# Patient Record
Sex: Male | Born: 1970 | Race: Black or African American | Hispanic: No | Marital: Single | Smoking: Current some day smoker
Health system: Southern US, Community
[De-identification: ages and names within clinical notes are randomized; demographics above are authoritative.]

## PROBLEM LIST (undated history)

## (undated) DIAGNOSIS — I10 Essential (primary) hypertension: Secondary | ICD-10-CM

## (undated) DIAGNOSIS — W3400XA Accidental discharge from unspecified firearms or gun, initial encounter: Secondary | ICD-10-CM

## (undated) DIAGNOSIS — R7303 Prediabetes: Secondary | ICD-10-CM

## (undated) DIAGNOSIS — Y249XXA Unspecified firearm discharge, undetermined intent, initial encounter: Secondary | ICD-10-CM

## (undated) HISTORY — DX: Prediabetes: R73.03

---

## 2015-01-24 ENCOUNTER — Emergency Department (HOSPITAL_COMMUNITY)
Admission: EM | Admit: 2015-01-24 | Discharge: 2015-01-24 | Disposition: A | Discharge status: Home / Self Care | Payer: Managed Care, Other (non HMO) | Attending: Emergency Medicine | Admitting: Emergency Medicine

## 2015-01-24 ENCOUNTER — Encounter (HOSPITAL_COMMUNITY): Payer: Self-pay | Admitting: Nurse Practitioner

## 2015-01-24 DIAGNOSIS — Z720 Tobacco use: Secondary | ICD-10-CM | POA: Insufficient documentation

## 2015-01-24 DIAGNOSIS — M436 Torticollis: Secondary | ICD-10-CM | POA: Insufficient documentation

## 2015-01-24 DIAGNOSIS — M542 Cervicalgia: Secondary | ICD-10-CM | POA: Diagnosis present

## 2015-01-24 MED ORDER — IBUPROFEN 600 MG PO TABS: 600.0000 mg | ORAL_TABLET | Freq: Four times a day (QID) | ORAL | Status: DC | PRN

## 2015-01-24 MED ORDER — METHOCARBAMOL 500 MG PO TABS
500.0000 mg | ORAL_TABLET | Freq: Once | ORAL | Status: AC
  Administered 2015-01-24: 500 mg via ORAL
  Filled 2015-01-24: qty 1

## 2015-01-24 MED ORDER — METHOCARBAMOL 500 MG PO TABS: 500.0000 mg | ORAL_TABLET | Freq: Two times a day (BID) | ORAL | Status: DC

## 2015-01-24 NOTE — Discharge Instructions (Signed)
Please follow up with your primary care physician in 1-2 days. If you do not have one please call the [facility] number listed above. Please take pain medication and/or muscle relaxants as prescribed and as needed for pain. Please do not drive on narcotic pain medication or on muscle relaxants. Please read all discharge instructions and return precautions.  ° °Torticollis, Acute °You have suddenly (acutely) developed a twisted neck (torticollis). This is usually a self-limited condition. °CAUSES  °Acute torticollis may be caused by malposition, trauma or infection. Most commonly, acute torticollis is caused by sleeping in an awkward position. Torticollis may also be caused by the flexion, extension or twisting of the neck muscles beyond their normal position. Sometimes, the exact cause may not be known. °SYMPTOMS  °Usually, there is pain and limited movement of the neck. Your neck may twist to one side. °DIAGNOSIS  °The diagnosis is often made by physical examination. X-rays, CT scans or MRIs may be done if there is a history of trauma or concern of infection. °TREATMENT  °For a common, stiff neck that develops during sleep, treatment is focused on relaxing the contracted neck muscle. Medications (including shots) may be used to treat the problem. Most cases resolve in several days. Torticollis usually responds to conservative physical therapy. If left untreated, the shortened and spastic neck muscle can cause deformities in the face and neck. Rarely, surgery is required. °HOME CARE INSTRUCTIONS  °· Use over-the-counter and prescription medications as directed by your caregiver. °· Do stretching exercises and massage the neck as directed by your caregiver. °· Follow up with physical therapy if needed and as directed by your caregiver. °SEEK IMMEDIATE MEDICAL CARE IF:  °· You develop difficulty breathing or noisy breathing (stridor). °· You drool, develop trouble swallowing or have pain with  swallowing. °· You develop numbness or weakness in the hands or feet. °· You have changes in speech or vision. °· You have problems with urination or bowel movements. °· You have difficulty walking. °· You have a fever. °· You have increased pain. °MAKE SURE YOU:  °· Understand these instructions. °· Will watch your condition. °· Will get help right away if you are not doing well or get worse. °Document Released: 08/24/2000 Document Revised: 11/19/2011 Document Reviewed: 10/05/2009 °[employer]® Patient Information ©2015 [employer]. This information is not intended to replace advice given to you by your health care provider. Make sure you discuss any questions you have with your health care provider. ° °

## 2015-01-24 NOTE — ED Notes (Signed)
He woke this am with pain and stiffness in neck and its been worse since. He had to leave work because he can not perform his job since he cant turn his neck and his boss told him he had to come to the hospital for evaluation. He has tried nothing for the pain. States 'it feels like a crick." A&Ox4, resp e/u

## 2015-01-24 NOTE — ED Provider Notes (Signed)
CSN: [id number]     Arrival date & time 01/24/15  1349 History  This chart was scribed for non-physician practitioner working with Doug SouSam Jacubowitz, MD by Richarda Overlieichard Holland, ED Scribe. This patient was seen in room TR06C/TR06C and the patient's care was started at 3:21 PM.   Chief Complaint  Patient presents with  . Neck Pain   The history is provided by the patient. No language interpreter was used.   HPI Comments: Edward Watts is a 44 y.o. male who presents to the Emergency Department complaining of worsening left sided neck pain that started this morning. He states that turning his neck to the left aggravates his pain. He says he had to leave work because his pain is affecting his job performance and his boss told him to present to the hospital for evaluation. He states he has taken no medications PTA. Pt denies prior neck surgeries. He denies any falls or injuries. He reports no pertinent past medical history at this time. Pt denies HA, CP, trouble breathing or back pain at this time.   History reviewed. No pertinent past medical history.   History reviewed. No pertinent past surgical history.   History reviewed. No pertinent family history.   History  Substance Use Topics  . Smoking status: Current Every Day Smoker    Types: Cigarettes  . Smokeless tobacco: Not on file  . Alcohol Use: No    Review of Systems  Cardiovascular: Negative for chest pain.  Musculoskeletal: Positive for neck pain.  Neurological: Negative for weakness and headaches.  All other systems reviewed and are negative.   Allergies  Review of patient's allergies indicates no known allergies.  Home Medications   Prior to Admission medications   Medication Sig Start Date End Date Taking? Authorizing Provider  ibuprofen (ADVIL,MOTRIN) 600 MG tablet Take 1 tablet (600 mg total) by mouth every 6 (six) hours as needed. 01/24/15   Sophina Mitten, PA-C  methocarbamol (ROBAXIN) 500 MG tablet Take 1 tablet  (500 mg total) by mouth 2 (two) times daily. 01/24/15   Muriel Wilber, PA-C   BP 169/112 mmHg  Pulse 95  Temp(Src) 98.1 F (36.7 C) (Oral)  Resp 20  SpO2 97% Physical Exam  Constitutional: He is oriented to person, place, and time. He appears well-developed and well-nourished. No distress.  HENT:  Head: Normocephalic and atraumatic.  Right Ear: External ear normal.  Left Ear: External ear normal.  Nose: Nose normal.  Mouth/Throat: Oropharynx is clear and moist. No oropharyngeal exudate.  Eyes: Conjunctivae and EOM are normal. Pupils are equal, round, and reactive to light.  Neck: Normal range of motion. Neck supple. Muscular tenderness present. No spinous process tenderness present. No edema, no erythema and normal range of motion present.    Cardiovascular: Normal rate, regular rhythm, normal heart sounds and intact distal pulses.   Pulmonary/Chest: Effort normal and breath sounds normal. No respiratory distress.  Abdominal: Soft. There is no tenderness.  Neurological: He is alert and oriented to person, place, and time. He has normal strength. No cranial nerve deficit. Gait normal. GCS eye subscore is 4. GCS verbal subscore is 5. GCS motor subscore is 6.  Sensation grossly intact.  No pronator drift.  Bilateral heel-knee-shin intact.  Skin: Skin is warm and dry. He is not diaphoretic.  Nursing note and vitals reviewed.   ED Course  Procedures   Medications  methocarbamol (ROBAXIN) tablet 500 mg (500 mg Oral Given 01/24/15 1534)    DIAGNOSTIC STUDIES: Oxygen Saturation is 95%  on RA, normal by my interpretation.    COORDINATION OF CARE: 3:24 PM Discussed treatment plan with pt at bedside and pt agreed to plan.   Labs Review Labs Reviewed - No data to display  Imaging Review No results found.   EKG Interpretation None      MDM   Final diagnoses:  Left torticollis   Filed Vitals:   01/24/15 1529  BP: 169/112  Pulse: 95  Temp: 98.1 F (36.7 C)   Resp: 20   Afebrile, NAD, non-toxic appearing, AAOx4 appropriate for age.   1) Torticollis: No neurofocal deficits on examination. No spinous process tenderness. Paraspinal spasm noted on left. Will treat with conservative measures.  2) HTN: Patient noted to be hypertensive in the emergency department.  No signs of hypertensive urgency.  Discussed with patient the need for close follow-up and management by their primary care physician.   Return precautions discussed. Patient is agreeable to plan. Patient stable at time of discharge.   I personally performed the services described in this documentation, which was scribed in my presence. The recorded information has been reviewed and is accurate.       Francee PiccoloJennifer Zaylee Cornia, PA-C 01/24/15 1625  Doug SouSam Jacubowitz, MD 01/24/15 1743

## 2018-06-11 ENCOUNTER — Emergency Department (HOSPITAL_COMMUNITY): Payer: BLUE CROSS/BLUE SHIELD

## 2018-06-11 ENCOUNTER — Encounter (HOSPITAL_COMMUNITY): Payer: Self-pay | Admitting: Emergency Medicine

## 2018-06-11 ENCOUNTER — Emergency Department (HOSPITAL_COMMUNITY)
Admission: EM | Admit: 2018-06-11 | Discharge: 2018-06-11 | Disposition: A | Discharge status: Home / Self Care | Payer: BLUE CROSS/BLUE SHIELD | Attending: Emergency Medicine | Admitting: Emergency Medicine

## 2018-06-11 DIAGNOSIS — F17210 Nicotine dependence, cigarettes, uncomplicated: Secondary | ICD-10-CM | POA: Insufficient documentation

## 2018-06-11 DIAGNOSIS — J40 Bronchitis, not specified as acute or chronic: Secondary | ICD-10-CM

## 2018-06-11 DIAGNOSIS — Z9119 Patient's noncompliance with other medical treatment and regimen: Secondary | ICD-10-CM | POA: Insufficient documentation

## 2018-06-11 DIAGNOSIS — I10 Essential (primary) hypertension: Secondary | ICD-10-CM | POA: Insufficient documentation

## 2018-06-11 DIAGNOSIS — R05 Cough: Secondary | ICD-10-CM | POA: Diagnosis present

## 2018-06-11 DIAGNOSIS — R030 Elevated blood-pressure reading, without diagnosis of hypertension: Secondary | ICD-10-CM | POA: Insufficient documentation

## 2018-06-11 HISTORY — DX: Essential (primary) hypertension: I10

## 2018-06-11 HISTORY — DX: Accidental discharge from unspecified firearms or gun, initial encounter: W34.00XA

## 2018-06-11 HISTORY — DX: Unspecified firearm discharge, undetermined intent, initial encounter: Y24.9XXA

## 2018-06-11 LAB — COMPREHENSIVE METABOLIC PANEL
ALK PHOS: 88 U/L (ref 38–126)
ALT: 25 U/L (ref 0–44)
AST: 23 U/L (ref 15–41)
Albumin: 3.8 g/dL (ref 3.5–5.0)
Anion gap: 7 (ref 5–15)
BILIRUBIN TOTAL: 0.7 mg/dL (ref 0.3–1.2)
BUN: 21 mg/dL — ABNORMAL HIGH (ref 6–20)
CALCIUM: 8.7 mg/dL — AB (ref 8.9–10.3)
CO2: 24 mmol/L (ref 22–32)
Chloride: 109 mmol/L (ref 98–111)
Creatinine, Ser: 1.26 mg/dL — ABNORMAL HIGH (ref 0.61–1.24)
Glucose, Bld: 157 mg/dL — ABNORMAL HIGH (ref 70–99)
Potassium: 4.1 mmol/L (ref 3.5–5.1)
Sodium: 140 mmol/L (ref 135–145)
TOTAL PROTEIN: 6.4 g/dL — AB (ref 6.5–8.1)

## 2018-06-11 LAB — CBC
HCT: 48.4 % (ref 39.0–52.0)
Hemoglobin: 16.4 g/dL (ref 13.0–17.0)
MCH: 31.2 pg (ref 26.0–34.0)
MCHC: 33.9 g/dL (ref 30.0–36.0)
MCV: 92.2 fL (ref 78.0–100.0)
PLATELETS: 227 10*3/uL (ref 150–400)
RBC: 5.25 MIL/uL (ref 4.22–5.81)
RDW: 12.9 % (ref 11.5–15.5)
WBC: 10.4 10*3/uL (ref 4.0–10.5)

## 2018-06-11 LAB — TYPE AND SCREEN
ABO/RH(D): B POS
Antibody Screen: NEGATIVE

## 2018-06-11 LAB — ABO/RH: ABO/RH(D): B POS

## 2018-06-11 LAB — POC OCCULT BLOOD, ED: FECAL OCCULT BLD: NEGATIVE

## 2018-06-11 MED ORDER — HYDROCHLOROTHIAZIDE 25 MG PO TABS: 25.0000 mg | ORAL_TABLET | Freq: Every day | ORAL | 0 refills | Status: DC

## 2018-06-11 MED ORDER — ALBUTEROL SULFATE HFA 108 (90 BASE) MCG/ACT IN AERS
2.0000 | INHALATION_SPRAY | RESPIRATORY_TRACT | Status: DC | PRN
  Administered 2018-06-11: 2 via RESPIRATORY_TRACT
  Filled 2018-06-11: qty 6.7

## 2018-06-11 MED ORDER — AEROCHAMBER PLUS FLO-VU MEDIUM MISC
1.0000 | Freq: Once | Status: AC
  Administered 2018-06-11: 1
  Filled 2018-06-11: qty 1

## 2018-06-11 MED ORDER — AEROCHAMBER PLUS FLO-VU MISC
1.0000 | Freq: Once | Status: DC
  Filled 2018-06-11: qty 1

## 2018-06-11 NOTE — Discharge Instructions (Addendum)
Use your inhaler 2 puffs every 4 hours as needed for cough or shortness of breath.  Take your blood pressure medication as prescribed.  Call the number on these instructions to get a new primary care physician your primary care physician can refer you to a specialist to investigate rectal bleeding further.  Ask your new primary care doctor to help you to stop smoking.  Lab work shows that your blood sugar is mildly elevated today at 157.  Ask your primary care physician to check for diabetes with a test note his hemoglobin A1c.

## 2018-06-11 NOTE — ED Triage Notes (Signed)
Reports cough for 2 weeks with taking OTC meds with no relief. Last night noticed blood on toilet paper when wiped after having BM, denies BM being hard are having to strain.  C/o body being sore form coughing. reports that doesn't have PCP to follow up with to get HTN meds. reports been spacing pills out.
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
## 2018-10-13 ENCOUNTER — Encounter (HOSPITAL_COMMUNITY): Payer: Self-pay | Admitting: Emergency Medicine

## 2018-10-13 ENCOUNTER — Ambulatory Visit (HOSPITAL_COMMUNITY)
Admission: EM | Admit: 2018-10-13 | Discharge: 2018-10-13 | Disposition: A | Discharge status: Home / Self Care | Payer: BLUE CROSS/BLUE SHIELD | Attending: Family Medicine | Admitting: Family Medicine

## 2018-10-13 DIAGNOSIS — Z720 Tobacco use: Secondary | ICD-10-CM

## 2018-10-13 DIAGNOSIS — J209 Acute bronchitis, unspecified: Secondary | ICD-10-CM

## 2018-10-13 DIAGNOSIS — I10 Essential (primary) hypertension: Secondary | ICD-10-CM

## 2018-10-13 MED ORDER — BENZONATATE 100 MG PO CAPS: 200.0000 mg | ORAL_CAPSULE | Freq: Three times a day (TID) | ORAL | 0 refills | Status: DC

## 2018-10-13 MED ORDER — ALBUTEROL SULFATE HFA 108 (90 BASE) MCG/ACT IN AERS: 1.0000 | INHALATION_SPRAY | Freq: Four times a day (QID) | RESPIRATORY_TRACT | 0 refills | Status: DC | PRN

## 2018-10-13 MED ORDER — GUAIFENESIN-CODEINE 100-10 MG/5ML PO SOLN: 10.0000 mL | Freq: Four times a day (QID) | ORAL | 0 refills | Status: DC | PRN

## 2018-10-13 MED ORDER — HYDROCHLOROTHIAZIDE 25 MG PO TABS: 25.0000 mg | ORAL_TABLET | Freq: Every day | ORAL | 0 refills | Status: DC

## 2018-10-13 MED ORDER — AMLODIPINE BESYLATE 5 MG PO TABS: 5.0000 mg | ORAL_TABLET | Freq: Every day | ORAL | 0 refills | Status: DC

## 2018-10-13 NOTE — ED Triage Notes (Signed)
Pt here for cough and congestion 
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
## 2018-10-13 NOTE — Discharge Instructions (Signed)
Conservative therapy with cool mist humidifier at night, rest/fluids, and honey daily. Recommend 1 tablespoon/day. Also recommended over the counter anti tussive medication: robitussin DM  Codeine cough syrup at night Sending in tessalon pearls prn for cough. Albuterol inhaler prn  Can take up to 6 weeks to get better. Let us know if fever/worsening symptoms.  Ibuprofen for pain in ribs

## 2019-02-11 ENCOUNTER — Other Ambulatory Visit: Payer: Self-pay

## 2019-02-11 ENCOUNTER — Encounter (HOSPITAL_COMMUNITY): Payer: Self-pay

## 2019-02-11 ENCOUNTER — Ambulatory Visit (INDEPENDENT_AMBULATORY_CARE_PROVIDER_SITE_OTHER): Payer: BC Managed Care – PPO

## 2019-02-11 ENCOUNTER — Ambulatory Visit (HOSPITAL_COMMUNITY)
Admission: EM | Admit: 2019-02-11 | Discharge: 2019-02-11 | Disposition: A | Discharge status: Home / Self Care | Payer: BC Managed Care – PPO | Attending: Family Medicine | Admitting: Family Medicine

## 2019-02-11 DIAGNOSIS — S6991XA Unspecified injury of right wrist, hand and finger(s), initial encounter: Secondary | ICD-10-CM | POA: Diagnosis not present

## 2019-02-11 MED ORDER — IBUPROFEN 600 MG PO TABS: 600.0000 mg | ORAL_TABLET | Freq: Four times a day (QID) | ORAL | 0 refills | Status: DC | PRN

## 2019-02-11 MED ORDER — CEPHALEXIN 500 MG PO CAPS: 500.0000 mg | ORAL_CAPSULE | Freq: Four times a day (QID) | ORAL | 0 refills | Status: AC

## 2019-02-11 NOTE — Discharge Instructions (Addendum)
Xray normal Begin keflex 4 times daily for next 5 days to cover infection Continue ice Use anti-inflammatories for pain/swelling. You may take up to 600-800 mg Ibuprofen every 8 hours with food. You may supplement Ibuprofen with Tylenol [phone] mg every 8 hours.

## 2019-02-11 NOTE — ED Triage Notes (Signed)
Pt presents with injury to right index finger from a durable tag a few weeks ago trying to rip it off.
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
## 2020-02-14 ENCOUNTER — Other Ambulatory Visit: Payer: Self-pay | Admitting: Family Medicine

## 2020-03-29 IMAGING — DX RIGHT INDEX FINGER 2+V
3 series · 3 of 3 positions shown · non-contrast
Comparison: None.

CLINICAL DATA: Injury 3 weeks ago with persistent pain and swelling
at the PIP joint.

EXAM:
RIGHT INDEX FINGER 2+V

[finger ap]
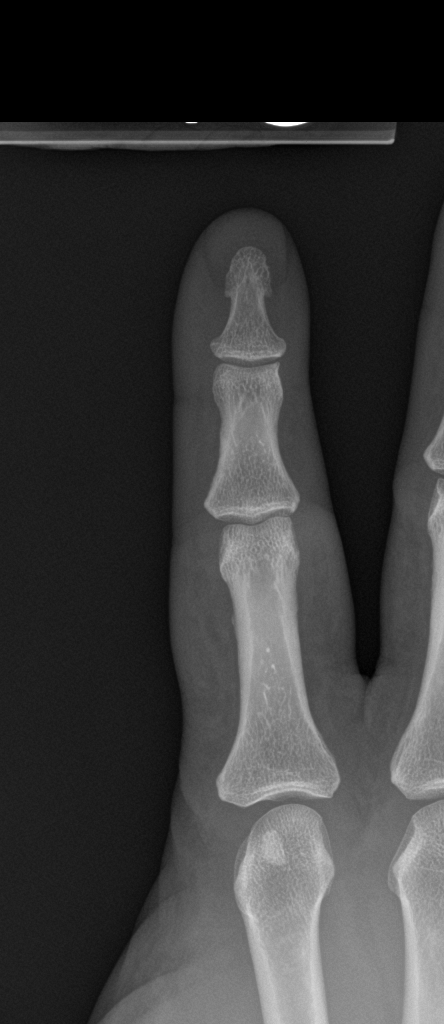

[finger obl]
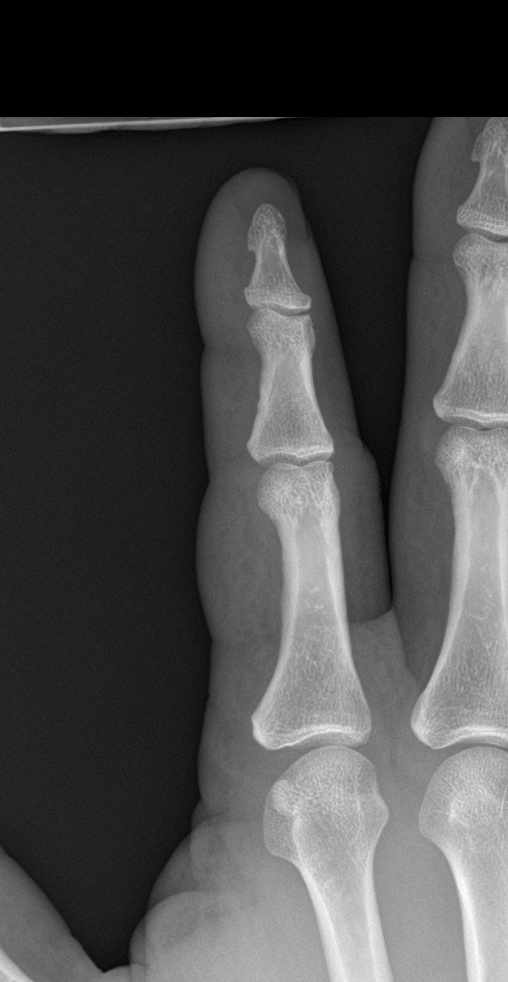

[finger lat]
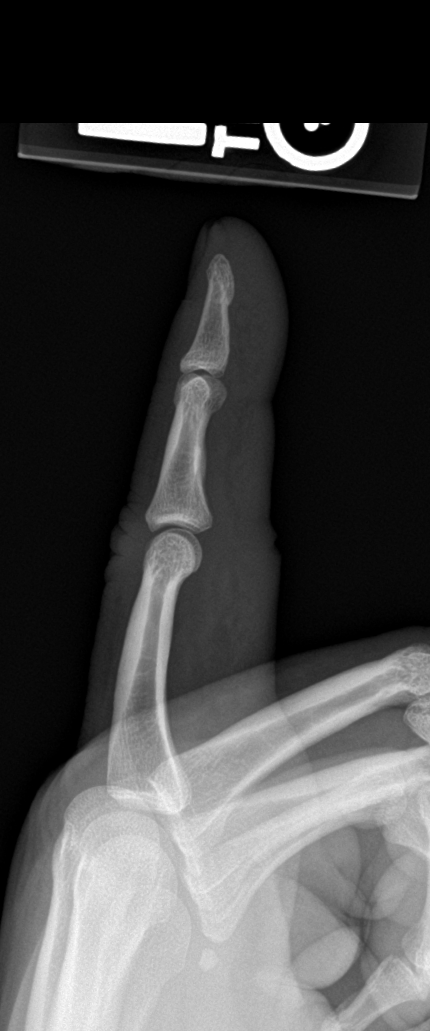

[3 of 3 positions shown; findings below may reference images not displayed]

FINDINGS: Nonspecific regional soft tissue swelling. No evidence of fracture
or dislocation. No sign of cortical avulsion.
IMPRESSION: Soft tissue swelling.  No abnormal bone or joint finding.

## 2020-05-20 ENCOUNTER — Other Ambulatory Visit: Payer: Self-pay | Admitting: Family Medicine

## 2020-07-12 ENCOUNTER — Other Ambulatory Visit: Payer: Self-pay

## 2020-07-12 ENCOUNTER — Ambulatory Visit (HOSPITAL_COMMUNITY)
Admission: EM | Admit: 2020-07-12 | Discharge: 2020-07-12 | Disposition: A | Discharge status: Home / Self Care | Payer: BC Managed Care – PPO | Attending: Family Medicine | Admitting: Family Medicine

## 2020-07-12 ENCOUNTER — Encounter (HOSPITAL_COMMUNITY): Payer: Self-pay | Admitting: *Deleted

## 2020-07-12 DIAGNOSIS — I10 Essential (primary) hypertension: Secondary | ICD-10-CM

## 2020-07-12 DIAGNOSIS — R062 Wheezing: Secondary | ICD-10-CM

## 2020-07-12 DIAGNOSIS — R059 Cough, unspecified: Secondary | ICD-10-CM | POA: Diagnosis not present

## 2020-07-12 MED ORDER — HYDROCODONE-HOMATROPINE 5-1.5 MG/5ML PO SYRP: 5.0000 mL | ORAL_SOLUTION | Freq: Four times a day (QID) | ORAL | 0 refills | Status: DC | PRN

## 2020-07-12 MED ORDER — AMLODIPINE BESYLATE 5 MG PO TABS: 5.0000 mg | ORAL_TABLET | Freq: Every day | ORAL | 2 refills | Status: DC

## 2020-07-12 MED ORDER — PREDNISONE 20 MG PO TABS: 40.0000 mg | ORAL_TABLET | Freq: Every day | ORAL | 0 refills | Status: DC
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
## 2020-07-12 NOTE — ED Triage Notes (Signed)
C/O cough and scratchy throat x approx 1 wk.  C/O difficulty sleeping due to cough.  Pt states he does not wish to have Covid test.  Also reports running out of HTN med approx 1 wk ago; requesting refill.  Denies HA.

## 2020-07-12 NOTE — Discharge Instructions (Addendum)
Your blood pressure was noted to be elevated during your visit today. If you are currently taking medication for high blood pressure, please ensure you are taking this as directed. If you do not have a history of high blood pressure and your blood pressure remains persistently elevated, you may need to begin taking a medication at some point. You may return here within the next few days to recheck if unable to see your primary care provider or if do not have a one.  BP (!) 178/110   Pulse 78   Temp 97.6 F (36.4 C) (Oral)   Resp 20   SpO2 97%   Be aware, your cough medication may cause drowsiness. Please do not drive, operate heavy machinery or make important decisions while on this medication, it can cloud your judgement.

## 2021-03-06 ENCOUNTER — Ambulatory Visit (HOSPITAL_COMMUNITY)
Admission: EM | Admit: 2021-03-06 | Discharge: 2021-03-06 | Disposition: A | Discharge status: Home / Self Care | Payer: BC Managed Care – PPO | Attending: Family Medicine | Admitting: Family Medicine

## 2021-03-06 ENCOUNTER — Encounter (HOSPITAL_COMMUNITY): Payer: Self-pay

## 2021-03-06 ENCOUNTER — Other Ambulatory Visit: Payer: Self-pay

## 2021-03-06 DIAGNOSIS — Z760 Encounter for issue of repeat prescription: Secondary | ICD-10-CM

## 2021-03-06 DIAGNOSIS — I10 Essential (primary) hypertension: Secondary | ICD-10-CM

## 2021-03-06 DIAGNOSIS — R030 Elevated blood-pressure reading, without diagnosis of hypertension: Secondary | ICD-10-CM

## 2021-03-06 MED ORDER — AMLODIPINE BESYLATE 10 MG PO TABS: 10.0000 mg | ORAL_TABLET | Freq: Every day | ORAL | 0 refills | Status: DC

## 2021-03-06 NOTE — ED Triage Notes (Signed)
Pt presents with for medication refill.   Pt states he does not have a primary Provider yet and only has 1 pill left.   States he needs a refill on Amlodipine.
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
## 2021-03-07 ENCOUNTER — Telehealth (HOSPITAL_BASED_OUTPATIENT_CLINIC_OR_DEPARTMENT_OTHER): Payer: Self-pay

## 2021-03-07 NOTE — Telephone Encounter (Signed)
LVMTCB re: needing PCP and scheduling appt @ [facility]

## 2021-05-25 ENCOUNTER — Other Ambulatory Visit: Payer: Self-pay | Admitting: Family Medicine

## 2021-11-01 ENCOUNTER — Ambulatory Visit (HOSPITAL_COMMUNITY)
Admission: EM | Admit: 2021-11-01 | Discharge: 2021-11-01 | Disposition: A | Discharge status: Home / Self Care | Payer: BC Managed Care – PPO | Attending: Family Medicine | Admitting: Family Medicine

## 2021-11-01 ENCOUNTER — Other Ambulatory Visit: Payer: Self-pay

## 2021-11-01 ENCOUNTER — Encounter (HOSPITAL_COMMUNITY): Payer: Self-pay | Admitting: Emergency Medicine

## 2021-11-01 DIAGNOSIS — I10 Essential (primary) hypertension: Secondary | ICD-10-CM

## 2021-11-01 MED ORDER — AMLODIPINE BESYLATE 10 MG PO TABS: 10.0000 mg | ORAL_TABLET | Freq: Every day | ORAL | 1 refills | Status: DC

## 2021-11-01 MED ORDER — LISINOPRIL 20 MG PO TABS: 20.0000 mg | ORAL_TABLET | Freq: Every day | ORAL | 1 refills | Status: DC
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
## 2021-11-01 NOTE — ED Triage Notes (Signed)
Pt reports that he was at dentist today about getting teeth pulled and was told BP was to high, 190/130. Pt here to get refill on HTN meds as doesnt have a PCP and has 5 tablets daily.

## 2021-11-01 NOTE — Discharge Instructions (Addendum)
Continue amlodipine 10 mg 1 daily  Start also taking lisinopril 20 mg 1 daily.  You can take these medicines together, or you can take 1 in the morning and one of them in the evening

## 2021-12-05 ENCOUNTER — Encounter (HOSPITAL_COMMUNITY): Payer: Self-pay | Admitting: Emergency Medicine

## 2021-12-05 ENCOUNTER — Ambulatory Visit (HOSPITAL_COMMUNITY)
Admission: EM | Admit: 2021-12-05 | Discharge: 2021-12-05 | Disposition: A | Discharge status: Home / Self Care | Payer: BC Managed Care – PPO | Attending: Family Medicine | Admitting: Family Medicine

## 2021-12-05 DIAGNOSIS — I10 Essential (primary) hypertension: Secondary | ICD-10-CM

## 2021-12-05 DIAGNOSIS — R051 Acute cough: Secondary | ICD-10-CM

## 2021-12-05 MED ORDER — BENZONATATE 100 MG PO CAPS: 100.0000 mg | ORAL_CAPSULE | Freq: Three times a day (TID) | ORAL | 0 refills | Status: DC | PRN

## 2021-12-05 MED ORDER — LOSARTAN POTASSIUM 50 MG PO TABS: 50.0000 mg | ORAL_TABLET | Freq: Every day | ORAL | 1 refills | Status: DC

## 2021-12-05 NOTE — Discharge Instructions (Addendum)
Stop taking lisinopril ? ?Start losartan 50 mg 1 daily.  This dose may need to be adjusted up by your new primary care office ? ?Continue amlodipine 10 mg 1 daily ? ?You can take benzonatate 100 mg--1 capsule 3 times daily as needed for cough.  This should not make you sleepy ?
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
## 2021-12-05 NOTE — ED Triage Notes (Signed)
Pt is present today with c/o cough. Pt states that sx started x2 weeks ago  ?

## 2021-12-22 NOTE — Progress Notes (Signed)
Erroneous encounter

## 2021-12-29 ENCOUNTER — Encounter: Payer: BC Managed Care – PPO | Admitting: Family

## 2021-12-29 DIAGNOSIS — I10 Essential (primary) hypertension: Secondary | ICD-10-CM

## 2021-12-29 DIAGNOSIS — Z7689 Persons encountering health services in other specified circumstances: Secondary | ICD-10-CM

## 2022-06-12 NOTE — Progress Notes (Addendum)
Subjective:    Edward Watts - 51 y.o. male MRN [id number]  Date of birth: 1971-04-16  HPI  Edward Watts is a 51 year-old male to establish care.   Current issues and/or concerns: History of high blood pressure. Last visit to assess blood pressure was 12/05/2021 at [facility]. States he has not taken blood pressure medications for at least 3 months due to being in between jobs and no health insurance. 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 He was taking Amlodipine and Losartan without issues or concerns. Intolerant to Hydrochlorothiazide which causes mood swings and Lisinopril which causes cough. He does not check blood pressures at home. He does add salt to food sometimes. No exercise outside of his normal routine. Denies red flag symptoms today in office. States sometimes he is able to tell when his blood pressure is higher than normal because his vision becomes blurry bilaterally. Smoking about 4 cigarettes daily. Reports does consume beverages with caffeine and sugar but does try to limit intake as much as possible. States he does not like being at [employer] and thinks this may be contributing to elevated blood pressure on today. Also, has some work-life balance stressors that may be contributing to blood pressure as well. States "my bottom feels wet." When asked of patient more information regarding this statement reports difficult to describe but does use a lot of soap to make sure his buttocks/rectum is cleansed well.      06/20/2022    4:23 PM  Depression screen PHQ 2/9  Decreased Interest 0  Down, Depressed, Hopeless 0  PHQ - 2 Score 0    ROS per HPI   Health Maintenance:  Health Maintenance Due  Topic Date Due   COVID-19 Vaccine (1) Never done   HIV Screening  Never done   Hepatitis C Screening  Never done    COLONOSCOPY (Pts 45-8yrs Insurance coverage will need to be confirmed)  Never done    Past Medical History: Patient Active Problem List   Diagnosis Date Noted   Primary hypertension 06/20/2022   Prediabetes 06/20/2022    Social History   reports that he has been smoking cigarettes. He has been exposed to tobacco smoke. He has never used smokeless tobacco. He reports that he does not drink alcohol and does not use drugs.   Family History  family history includes Hypertension in his father and mother.   Medications: reviewed and updated   Objective:   Physical Exam BP (!) 187/133 (BP Location: Left Arm, Patient Position: Sitting, Cuff Size: Large)   Pulse 89   Temp 98.3 F (36.8 C)   Resp 16   Ht 5' 5.75" (1.67 m)   Wt 165 lb (74.8 kg)   SpO2 96%   BMI 26.84 kg/m   Physical Exam HENT:     Head: Normocephalic and atraumatic.  Eyes:     Extraocular Movements: Extraocular movements intact.     Conjunctiva/sclera: Conjunctivae normal.     Pupils: Pupils are equal, round, and reactive to light.  Cardiovascular:     Rate and Rhythm: Normal rate and regular rhythm.     Pulses: Normal pulses.     Heart sounds: Normal heart sounds.  Pulmonary:     Effort: Pulmonary effort is normal.     Breath sounds: Normal breath sounds.  Musculoskeletal:     Cervical back: Normal range  of motion and neck supple.  Neurological:     General: No focal deficit present.     Mental Status: He is alert and oriented to person, place, and time.  Psychiatric:        Mood and Affect: Mood normal.        Behavior: Behavior normal.   Results for orders placed or performed in visit on 06/20/22  POCT glycosylated hemoglobin (Hb A1C)  Result Value Ref Range   Hemoglobin A1C 6.2 (A) 4.0 - 5.6 %   HbA1c POC (<> result, manual entry)     HbA1c, POC (prediabetic range)     HbA1c, POC (controlled diabetic range)      Assessment & Plan:  1. Primary hypertension - Blood pressure not at goal during  today's visit. Patient asymptomatic without chest pressure, chest pain, palpitations, shortness of breath, worst headache of life, and any additional red flag symptoms. - Hydralazine 50 mg once administered today in office without improvement of blood pressure.  - Discussed with patient the need to go to the emergency department for further evaluation and management. Patient declined stating he has an 91 year-old son that he has to go pickup right now and that he will get his blood pressure medications from the pharmacy today after he leaves office. States if he doesn't feel well he will go to the emergency department later since he works at [employer].  - Resume Amlodipine and Losartan as prescribed.  - Patient intolerant to Lisinopril and Hydrochlorothiazide. - CMP obtained, result pending. - Counseled on blood pressure goal of less than 130/80, low-sodium, DASH diet, medication compliance, and 150 minutes of moderate intensity exercise per week as tolerated. Counseled on medication adherence and adverse effects. - Referral to Cardiology for further evaluation and management should pharmacological management in primary care not improve blood pressure.  - Follow-up with primary provider in 1 week or sooner if needed for blood pressure check. - hydrALAZINE (APRESOLINE) tablet 50 mg - losartan (COZAAR) 50 MG tablet; Take 1 tablet (50 mg total) by mouth daily.  Dispense: 30 tablet; Refill: 2 - amLODipine (NORVASC) 10 MG tablet; Take 1 tablet (10 mg total) by mouth daily.  Dispense: 30 tablet; Refill: 2 - Ambulatory referral to Cardiology  2. Prediabetes - Hemoglobin A1c consistent with prediabetes. Discussed heart healthy diet, limiting sugars/carbohydrates, and exercise as tolerated. No medication needed as of present. Recheck in 6 months.  - POCT glycosylated hemoglobin (Hb A1C)  3. Colon cancer screening - Referral to Gastroenterology for colon cancer screening by  colonoscopy. - Ambulatory referral to Gastroenterology    Patient was given clear instructions to go to Emergency Department or return to medical center if symptoms don't improve, worsen, or new problems develop.The patient verbalized understanding.  I discussed the assessment and treatment plan with the patient. The patient was provided an opportunity to ask questions and all were answered. The patient agreed with the plan and demonstrated an understanding of the instructions.   The patient was advised to call back or seek an in-person evaluation if the symptoms worsen or if the condition fails to improve as anticipated.    Durene Fruits, NP 06/20/2022, 8:10 PM Primary Care at [facility]

## 2022-06-20 ENCOUNTER — Encounter: Payer: 59 | Admitting: Family

## 2022-06-20 ENCOUNTER — Other Ambulatory Visit: Payer: Self-pay

## 2022-06-20 ENCOUNTER — Encounter: Payer: Self-pay | Admitting: Family

## 2022-06-20 VITALS — BP 187/133 | HR 89 | Temp 98.3°F | Resp 16 | Ht 65.75 in | Wt 165.0 lb

## 2022-06-20 DIAGNOSIS — Z131 Encounter for screening for diabetes mellitus: Secondary | ICD-10-CM

## 2022-06-20 DIAGNOSIS — Z13220 Encounter for screening for lipoid disorders: Secondary | ICD-10-CM

## 2022-06-20 DIAGNOSIS — Z1329 Encounter for screening for other suspected endocrine disorder: Secondary | ICD-10-CM

## 2022-06-20 DIAGNOSIS — I10 Essential (primary) hypertension: Secondary | ICD-10-CM

## 2022-06-20 DIAGNOSIS — Z5320 Procedure and treatment not carried out because of patient's decision for unspecified reasons: Secondary | ICD-10-CM

## 2022-06-20 DIAGNOSIS — Z13228 Encounter for screening for other metabolic disorders: Secondary | ICD-10-CM

## 2022-06-20 DIAGNOSIS — Z1159 Encounter for screening for other viral diseases: Secondary | ICD-10-CM

## 2022-06-20 DIAGNOSIS — Z1211 Encounter for screening for malignant neoplasm of colon: Secondary | ICD-10-CM

## 2022-06-20 DIAGNOSIS — Z130 Encounter for screening for diseases of the blood and blood-forming organs and certain disorders involving the immune mechanism: Secondary | ICD-10-CM

## 2022-06-20 DIAGNOSIS — R7303 Prediabetes: Secondary | ICD-10-CM | POA: Insufficient documentation

## 2022-06-20 DIAGNOSIS — Z114 Encounter for screening for human immunodeficiency virus [HIV]: Secondary | ICD-10-CM

## 2022-06-20 DIAGNOSIS — Z0000 Encounter for general adult medical examination without abnormal findings: Secondary | ICD-10-CM

## 2022-06-20 LAB — POCT GLYCOSYLATED HEMOGLOBIN (HGB A1C): Hemoglobin A1C: 6.2 % — AB (ref 4.0–5.6)

## 2022-06-20 MED ORDER — AMLODIPINE BESYLATE 10 MG PO TABS: 10.0000 mg | ORAL_TABLET | Freq: Every day | ORAL | 2 refills | Status: DC

## 2022-06-20 MED ORDER — LOSARTAN POTASSIUM 50 MG PO TABS: 50.0000 mg | ORAL_TABLET | Freq: Every day | ORAL | 2 refills | Status: DC

## 2022-06-20 MED ORDER — HYDRALAZINE HCL 10 MG PO TABS
50.0000 mg | ORAL_TABLET | Freq: Once | ORAL | Status: AC
  Administered 2022-06-20: 50 mg via ORAL

## 2022-06-20 NOTE — Addendum Note (Signed)
Addended by: Elmon Else on: 06/20/2022 04:08 PM   Modules accepted: Orders

## 2022-06-20 NOTE — Addendum Note (Signed)
Addended by: Camillia Herter on: 06/20/2022 04:47 PM   Modules accepted: Orders

## 2022-06-20 NOTE — Patient Instructions (Signed)
Hypertension, Adult High blood pressure (hypertension) is when the force of blood pumping through the arteries is too strong. The arteries are the blood vessels that carry blood from the heart throughout the body. Hypertension forces the heart to work harder to pump blood and may cause arteries to become narrow or stiff. Untreated or uncontrolled hypertension can lead to a heart attack, heart failure, a stroke, kidney disease, and other problems. A blood pressure reading consists of a higher number over a lower number. Ideally, your blood pressure should be below 120/80. The first ("top") number is called the systolic pressure. It is a measure of the pressure in your arteries as your heart beats. The second ("bottom") number is called the diastolic pressure. It is a measure of the pressure in your arteries as the heart relaxes. What are the causes? The exact cause of this condition is not known. There are some conditions that result in high blood pressure. What increases the risk? Certain factors may make you more likely to develop high blood pressure. Some of these risk factors are under your control, including: Smoking. Not getting enough exercise or physical activity. Being overweight. Having too much fat, sugar, calories, or salt (sodium) in your diet. Drinking too much alcohol. Other risk factors include: Having a personal history of heart disease, diabetes, high cholesterol, or kidney disease. Stress. Having a family history of high blood pressure and high cholesterol. Having obstructive sleep apnea. Age. The risk increases with age. What are the signs or symptoms? High blood pressure may not cause symptoms. Very high blood pressure (hypertensive crisis) may cause: Headache. Fast or irregular heartbeats (palpitations). Shortness of breath. Nosebleed. Nausea and vomiting. Vision changes. Severe chest pain, dizziness, and seizures. How is this diagnosed? This condition is diagnosed by  measuring your blood pressure while you are seated, with your arm resting on a flat surface, your legs uncrossed, and your feet flat on the floor. The cuff of the blood pressure monitor will be placed directly against the skin of your upper arm at the level of your heart. Blood pressure should be measured at least twice using the same arm. Certain conditions can cause a difference in blood pressure between your right and left arms. If you have a high blood pressure reading during one visit or you have normal blood pressure with other risk factors, you may be asked to: Return on a different day to have your blood pressure checked again. Monitor your blood pressure at home for 1 week or longer. If you are diagnosed with hypertension, you may have other blood or imaging tests to help your health care provider understand your overall risk for other conditions. How is this treated? This condition is treated by making healthy lifestyle changes, such as eating healthy foods, exercising more, and reducing your alcohol intake. You may be referred for counseling on a healthy diet and physical activity. Your health care provider may prescribe medicine if lifestyle changes are not enough to get your blood pressure under control and if: Your systolic blood pressure is above 130. Your diastolic blood pressure is above 80. Your personal target blood pressure may vary depending on your medical conditions, your age, and other factors. Follow these instructions at home: Eating and drinking  Eat a diet that is high in fiber and potassium, and low in sodium, added sugar, and fat. An example of this eating plan is called the DASH diet. DASH stands for Dietary Approaches to Stop Hypertension. To eat this way: Eat   plenty of fresh fruits and vegetables. Try to fill one half of your plate at each meal with fruits and vegetables. Eat whole grains, such as whole-wheat pasta, brown rice, or whole-grain bread. Fill about one  fourth of your plate with whole grains. Eat or drink low-fat dairy products, such as skim milk or low-fat yogurt. Avoid fatty cuts of meat, processed or cured meats, and poultry with skin. Fill about one fourth of your plate with lean proteins, such as fish, chicken without skin, beans, eggs, or tofu. Avoid pre-made and processed foods. These tend to be higher in sodium, added sugar, and fat. Reduce your daily sodium intake. Many people with hypertension should eat less than 1,500 mg of sodium a day. Do not drink alcohol if: Your health care provider tells you not to drink. You are pregnant, may be pregnant, or are planning to become pregnant. If you drink alcohol: Limit how much you have to: 0-1 drink a day for women. 0-2 drinks a day for men. Know how much alcohol is in your drink. In the [place] one drink equals one 12 oz bottle of beer (355 mL), one 5 oz glass of wine (148 mL), or one 1 oz glass of hard liquor (44 mL). Lifestyle  Work with your health care provider to maintain a healthy body weight or to lose weight. Ask what an ideal weight is for you. Get at least 30 minutes of exercise that causes your heart to beat faster (aerobic exercise) most days of the week. Activities may include walking, swimming, or biking. Include exercise to strengthen your muscles (resistance exercise), such as Pilates or lifting weights, as part of your weekly exercise routine. Try to do these types of exercises for 30 minutes at least 3 days a week. Do not use any products that contain nicotine or tobacco. These products include cigarettes, chewing tobacco, and vaping devices, such as e-cigarettes. If you need help quitting, ask your health care provider. Monitor your blood pressure at home as told by your health care provider. Keep all follow-up visits. This is important. Medicines Take over-the-counter and prescription medicines only as told by your health care provider. Follow directions carefully. Blood  pressure medicines must be taken as prescribed. Do not skip doses of blood pressure medicine. Doing this puts you at risk for problems and can make the medicine less effective. Ask your health care provider about side effects or reactions to medicines that you should watch for. Contact a health care provider if you: Think you are having a reaction to a medicine you are taking. Have headaches that keep coming back (recurring). Feel dizzy. Have swelling in your ankles. Have trouble with your vision. Get help right away if you: Develop a severe headache or confusion. Have unusual weakness or numbness. Feel faint. Have severe pain in your chest or abdomen. Vomit repeatedly. Have trouble breathing. These symptoms may be an emergency. Get help right away. Call 911. Do not wait to see if the symptoms will go away. Do not drive yourself to the hospital. Summary Hypertension is when the force of blood pumping through your arteries is too strong. If this condition is not controlled, it may put you at risk for serious complications. Your personal target blood pressure may vary depending on your medical conditions, your age, and other factors. For most people, a normal blood pressure is less than 120/80. Hypertension is treated with lifestyle changes, medicines, or a combination of both. Lifestyle changes include losing weight, eating a healthy,   low-sodium diet, exercising more, and limiting alcohol. This information is not intended to replace advice given to you by your health care provider. Make sure you discuss any questions you have with your health care provider. Document Revised: 07/04/2021 Document Reviewed: 07/04/2021 Elsevier Patient Education  2023 Elsevier Inc.  

## 2022-06-20 NOTE — Addendum Note (Signed)
Addended by: Elmon Else on: 06/20/2022 04:23 PM   Modules accepted: Orders

## 2022-06-20 NOTE — Addendum Note (Signed)
Addended by: Camillia Herter on: 06/20/2022 05:12 PM   Modules accepted: Orders

## 2022-06-20 NOTE — Addendum Note (Signed)
Addended by: Camillia Herter on: 06/20/2022 08:38 PM   Modules accepted: Level of Service

## 2022-06-20 NOTE — Progress Notes (Addendum)
.  Pt presents to establish care,  -needs refill on Losartan and Amlodipine -states that he is having bilateral foot pain, pain mostly in heel of feet

## 2022-06-21 ENCOUNTER — Telehealth: Payer: Self-pay | Admitting: Family

## 2022-06-21 ENCOUNTER — Ambulatory Visit: Payer: Self-pay | Admitting: *Deleted

## 2022-06-21 DIAGNOSIS — I10 Essential (primary) hypertension: Secondary | ICD-10-CM

## 2022-06-21 LAB — CMP14+EGFR
ALT: 17 IU/L (ref 0–44)
AST: 17 IU/L (ref 0–40)
Albumin/Globulin Ratio: 1.7 (ref 1.2–2.2)
Albumin: 4.3 g/dL (ref 3.8–4.9)
Alkaline Phosphatase: 110 IU/L (ref 44–121)
BUN/Creatinine Ratio: 19 (ref 9–20)
BUN: 22 mg/dL (ref 6–24)
Bilirubin Total: 0.3 mg/dL (ref 0.0–1.2)
CO2: 22 mmol/L (ref 20–29)
Calcium: 9.1 mg/dL (ref 8.7–10.2)
Chloride: 106 mmol/L (ref 96–106)
Creatinine, Ser: 1.17 mg/dL (ref 0.76–1.27)
Globulin, Total: 2.5 g/dL (ref 1.5–4.5)
Glucose: 84 mg/dL (ref 70–99)
Potassium: 4.2 mmol/L (ref 3.5–5.2)
Sodium: 142 mmol/L (ref 134–144)
Total Protein: 6.8 g/dL (ref 6.0–8.5)
eGFR: 75 mL/min/{1.73_m2} (ref >59)

## 2022-06-21 LAB — SPECIMEN STATUS REPORT

## 2022-06-21 MED ORDER — LOSARTAN POTASSIUM 50 MG PO TABS
50.0000 mg | ORAL_TABLET | Freq: Every day | ORAL | 2 refills | Status: DC
  Filled 2022-07-24: qty 30, 30d supply, fill #0
  Filled 2022-10-23: qty 30, 30d supply, fill #1
  Filled 2023-01-24: qty 30, 30d supply, fill #2

## 2022-06-21 MED ORDER — AMLODIPINE BESYLATE 10 MG PO TABS
10.0000 mg | ORAL_TABLET | Freq: Every day | ORAL | 2 refills | Status: DC
  Filled 2022-07-24: qty 30, 30d supply, fill #0
  Filled 2022-10-23: qty 30, 30d supply, fill #1
  Filled 2023-01-24: qty 30, 30d supply, fill #2

## 2022-06-21 NOTE — Telephone Encounter (Signed)
Answer Assessment - Initial Assessment Questions 1. DRUG NAME: "What medicine do you need to have refilled?"     MD ordered meds yesterday and pharm states they did not receive order. There is no receipt received, will re-send them now. Norvasc and Losartan  Protocols used: Medication Refill and Renewal Call-A-AH

## 2022-06-21 NOTE — Progress Notes (Deleted)
Patient ID: Edward Watts, male    DOB: 1970/10/06  MRN: [id number]  CC: Blood Pressure Check  Subjective: Edward Watts is a 51 y.o. male who presents for blood pressure check.  His concerns today include:  Amlodipine, Losartan    Patient Active Problem List   Diagnosis Date Noted   Primary hypertension 06/20/2022   Prediabetes 06/20/2022     Current Outpatient Medications on File Prior to Visit  Medication Sig Dispense Refill   albuterol (PROVENTIL HFA;VENTOLIN HFA) 108 (90 Base) MCG/ACT inhaler Inhale 1-2 puffs into the lungs every 6 (six) hours as needed for wheezing or shortness of breath. 1 Inhaler 0   amLODipine (NORVASC) 10 MG tablet Take 1 tablet (10 mg total) by mouth daily. 30 tablet 2   losartan (COZAAR) 50 MG tablet Take 1 tablet (50 mg total) by mouth daily. 30 tablet 2   [DISCONTINUED] hydrochlorothiazide (HYDRODIURIL) 25 MG tablet Take 1 tablet (25 mg total) by mouth daily. 30 tablet 0   No current facility-administered medications on file prior to visit.    No Known Allergies  Social History   Socioeconomic History   Marital status: Single    Spouse name: Not on file   Number of children: Not on file   Years of education: Not on file   Highest education level: Not on file  Occupational History   Not on file  Tobacco Use   Smoking status: Some Days    Types: Cigarettes    Passive exposure: Current   Smokeless tobacco: Never   Tobacco comments:    1-2 cigarettes a day  Vaping Use   Vaping Use: Never used  Substance and Sexual Activity   Alcohol use: No   Drug use: No   Sexual activity: Not on file  Other Topics Concern   Not on file  Social History Narrative   Not on file   Social Determinants of Health   Financial Resource Strain: Not on file  Food Insecurity: Not on file  Transportation Needs: Not on file  Physical Activity: Not on file  Stress: Not on file  Social Connections: Not on file  Intimate Partner  Violence: Not on file    Family History  Problem Relation Age of Onset   Hypertension Mother    Hypertension Father     No past surgical history on file.  ROS: Review of Systems Negative except as stated above  PHYSICAL EXAM: There were no vitals taken for this visit.  Physical Exam  {male adult master:310786} {male adult master:310785}     Latest Ref Rng & Units 06/20/2022   12:00 AM 06/11/2018    2:21 PM  CMP  Glucose 70 - 99 mg/dL 84  157   BUN 6 - 24 mg/dL 22  21   Creatinine 0.76 - 1.27 mg/dL 1.17  1.26   Sodium 134 - 144 mmol/L 142  140   Potassium 3.5 - 5.2 mmol/L 4.2  4.1   Chloride 96 - 106 mmol/L 106  109   CO2 20 - 29 mmol/L 22  24   Calcium 8.7 - 10.2 mg/dL 9.1  8.7   Total Protein 6.0 - 8.5 g/dL 6.8  6.4   Total Bilirubin 0.0 - 1.2 mg/dL 0.3  0.7   Alkaline Phos 44 - 121 IU/L 110  88   AST 0 - 40 IU/L 17  23   ALT 0 - 44 IU/L 17  25    Lipid Panel  No results  found for: "CHOL", "TRIG", "HDL", "CHOLHDL", "VLDL", "LDLCALC", "LDLDIRECT"  CBC    Component Value Date/Time   WBC 10.4 06/11/2018 1421   RBC 5.25 06/11/2018 1421   HGB 16.4 06/11/2018 1421   HCT 48.4 06/11/2018 1421   PLT 227 06/11/2018 1421   MCV 92.2 06/11/2018 1421   MCH 31.2 06/11/2018 1421   MCHC 33.9 06/11/2018 1421   RDW 12.9 06/11/2018 1421    ASSESSMENT AND PLAN:  There are no diagnoses linked to this encounter.   Patient was given the opportunity to ask questions.  Patient verbalized understanding of the plan and was able to repeat key elements of the plan. Patient was given clear instructions to go to Emergency Department or return to medical center if symptoms don't improve, worsen, or new problems develop.The patient verbalized understanding.   No orders of the defined types were placed in this encounter.    Requested Prescriptions    No prescriptions requested or ordered in this encounter    No follow-ups on file.  Rema Fendt, NP

## 2022-06-25 ENCOUNTER — Telehealth: Payer: Self-pay | Admitting: Family

## 2022-06-25 NOTE — Telephone Encounter (Signed)
Thank you :)

## 2022-06-25 NOTE — Telephone Encounter (Signed)
Spoke w/patient to get him rescheduled due to Amy not in clinic on Oct. 17. He said that when he went to pick up the medication that she had subscribed "that it wasn't there". Unsure whom he talked with when he called "after hours" but he was not able to get it until this weekend. He has only been taking it for two days and is sure when she wants to see him at this point so that we can see how the medication is working. Please advise on appointment time frame.

## 2022-06-25 NOTE — Telephone Encounter (Signed)
Advisement of blood pressure check on 07/02/2022. Let patient know if he needs assistance prior to appointment to notify me.

## 2022-06-26 ENCOUNTER — Ambulatory Visit: Payer: 59 | Admitting: Family

## 2022-06-26 DIAGNOSIS — I10 Essential (primary) hypertension: Secondary | ICD-10-CM

## 2022-06-27 NOTE — Progress Notes (Signed)
Erroneous encounter-disregard

## 2022-07-03 ENCOUNTER — Encounter: Payer: 59 | Admitting: Family

## 2022-07-04 ENCOUNTER — Encounter (HOSPITAL_COMMUNITY): Payer: Self-pay

## 2022-07-04 ENCOUNTER — Ambulatory Visit (HOSPITAL_COMMUNITY)
Admission: EM | Admit: 2022-07-04 | Discharge: 2022-07-04 | Disposition: A | Discharge status: Home / Self Care | Payer: 59 | Attending: Physician Assistant | Admitting: Physician Assistant

## 2022-07-04 DIAGNOSIS — K0889 Other specified disorders of teeth and supporting structures: Secondary | ICD-10-CM

## 2022-07-04 MED ORDER — AMOXICILLIN 500 MG PO CAPS: 500.0000 mg | ORAL_CAPSULE | Freq: Three times a day (TID) | ORAL | 0 refills | Status: DC

## 2022-07-04 MED ORDER — DICLOFENAC SODIUM 75 MG PO TBEC: 75.0000 mg | DELAYED_RELEASE_TABLET | Freq: Two times a day (BID) | ORAL | 0 refills | Status: DC

## 2022-07-04 NOTE — Discharge Instructions (Signed)
See your Dentist as scheduled  °

## 2022-07-04 NOTE — ED Triage Notes (Signed)
Patient with c/o bottom left dental pain. States he has an appt with the dentist on Monday.
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
## 2022-07-11 ENCOUNTER — Ambulatory Visit: Payer: 59 | Attending: Internal Medicine | Admitting: Internal Medicine

## 2022-07-23 ENCOUNTER — Other Ambulatory Visit (HOSPITAL_COMMUNITY): Payer: Self-pay

## 2022-07-24 ENCOUNTER — Other Ambulatory Visit (HOSPITAL_COMMUNITY): Payer: Self-pay

## 2022-10-23 ENCOUNTER — Other Ambulatory Visit (HOSPITAL_COMMUNITY): Payer: Self-pay

## 2022-10-25 ENCOUNTER — Encounter (HOSPITAL_COMMUNITY): Payer: Self-pay | Admitting: *Deleted

## 2022-10-25 ENCOUNTER — Ambulatory Visit (HOSPITAL_COMMUNITY)
Admission: EM | Admit: 2022-10-25 | Discharge: 2022-10-25 | Disposition: A | Discharge status: Home / Self Care | Payer: 59 | Attending: Family Medicine | Admitting: Family Medicine

## 2022-10-25 DIAGNOSIS — M7711 Lateral epicondylitis, right elbow: Secondary | ICD-10-CM | POA: Diagnosis not present
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
## 2022-10-25 NOTE — ED Triage Notes (Signed)
Pt states he has been having right elbow and lower arm pain which has been the getting worse the last month. 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 He states more pain with movement.

## 2022-10-25 NOTE — Discharge Instructions (Addendum)
You were seen today for right arm pain.  This is typical for tennis elbow.  I have given you information on this today.  I recommend you get a brace from your local pharmacy.  I recommend motrin 647m four times/day for pain/swelling.  You can use ice, and rest it.  You may follow up with a orthopedist if you continue with pain, such as [facility] at [phone].

## 2023-01-24 ENCOUNTER — Other Ambulatory Visit (HOSPITAL_COMMUNITY): Payer: Self-pay

## 2023-02-05 ENCOUNTER — Other Ambulatory Visit: Payer: Self-pay

## 2023-02-16 ENCOUNTER — Other Ambulatory Visit: Payer: Self-pay | Admitting: Family

## 2023-02-16 DIAGNOSIS — I10 Essential (primary) hypertension: Secondary | ICD-10-CM

## 2023-02-18 NOTE — Telephone Encounter (Signed)
Requested medication (s) are due for refill today:   Requested medication (s) are on the active medication list: Yes  Last refill:  06/21/22  Future visit scheduled: No  Notes to clinic:  Do not see where pt. Is seen in the practice.    Requested Prescriptions  Pending Prescriptions Disp Refills   amLODipine (NORVASC) 10 MG tablet 30 tablet 2    Sig: Take 1 tablet (10 mg total) by mouth daily.     Cardiovascular: Calcium Channel Blockers 2 Failed - 02/16/2023  7:49 PM      Failed - Last BP in normal range    BP Readings from Last 1 Encounters:  10/25/22 (!) 190/107         Failed - Valid encounter within last 6 months    Recent Outpatient Visits   None            Passed - Last Heart Rate in normal range    Pulse Readings from Last 1 Encounters:  10/25/22 83          losartan (COZAAR) 50 MG tablet 30 tablet 2    Sig: Take 1 tablet (50 mg total) by mouth daily.     Cardiovascular:  Angiotensin Receptor Blockers Failed - 02/16/2023  7:49 PM      Failed - Cr in normal range and within 180 days    Creatinine, Ser  Date Value Ref Range Status  06/20/2022 1.17 0.76 - 1.27 mg/dL Final         Failed - K in normal range and within 180 days    Potassium  Date Value Ref Range Status  06/20/2022 4.2 3.5 - 5.2 mmol/L Final         Failed - Last BP in normal range    BP Readings from Last 1 Encounters:  10/25/22 (!) 190/107         Failed - Valid encounter within last 6 months    Recent Outpatient Visits   None            Passed - Patient is not pregnant

## 2023-03-27 ENCOUNTER — Other Ambulatory Visit (HOSPITAL_COMMUNITY): Payer: Self-pay

## 2023-07-25 ENCOUNTER — Other Ambulatory Visit: Payer: Self-pay | Admitting: Family

## 2023-07-25 DIAGNOSIS — I10 Essential (primary) hypertension: Secondary | ICD-10-CM

## 2023-07-25 NOTE — Telephone Encounter (Signed)
Requested medication (s) are due for refill today: yes  Requested medication (s) are on the active medication list: yes    Last refill: 06/21/22  #30  2 refills   both meds  Future visit scheduled no  Notes to clinic:Pt needs appt.  Attempted to reach, left VM to call back to schedule.  Requested Prescriptions  Pending Prescriptions Disp Refills   amLODipine (NORVASC) 10 MG tablet 30 tablet 2    Sig: Take 1 tablet (10 mg total) by mouth daily.     Cardiovascular: Calcium Channel Blockers 2 Failed - 07/25/2023 11:56 AM      Failed - Last BP in normal range    BP Readings from Last 1 Encounters:  10/25/22 (!) 190/107         Failed - Valid encounter within last 6 months    Recent Outpatient Visits   None            Passed - Last Heart Rate in normal range    Pulse Readings from Last 1 Encounters:  10/25/22 83          losartan (COZAAR) 50 MG tablet 30 tablet 2    Sig: Take 1 tablet (50 mg total) by mouth daily.     Cardiovascular:  Angiotensin Receptor Blockers Failed - 07/25/2023 11:56 AM      Failed - Cr in normal range and within 180 days    Creatinine, Ser  Date Value Ref Range Status  06/20/2022 1.17 0.76 - 1.27 mg/dL Final         Failed - K in normal range and within 180 days    Potassium  Date Value Ref Range Status  06/20/2022 4.2 3.5 - 5.2 mmol/L Final         Failed - Last BP in normal range    BP Readings from Last 1 Encounters:  10/25/22 (!) 190/107         Failed - Valid encounter within last 6 months    Recent Outpatient Visits   None            Passed - Patient is not pregnant

## 2023-07-26 ENCOUNTER — Other Ambulatory Visit (HOSPITAL_COMMUNITY): Payer: Self-pay

## 2023-07-26 ENCOUNTER — Encounter (HOSPITAL_COMMUNITY): Payer: Self-pay

## 2023-08-23 ENCOUNTER — Other Ambulatory Visit: Payer: Self-pay | Admitting: Family

## 2023-08-23 ENCOUNTER — Other Ambulatory Visit (HOSPITAL_COMMUNITY): Payer: Self-pay

## 2023-08-23 ENCOUNTER — Ambulatory Visit (HOSPITAL_COMMUNITY)
Admission: EM | Admit: 2023-08-23 | Discharge: 2023-08-23 | Disposition: A | Discharge status: Home / Self Care | Payer: Commercial Managed Care - HMO | Attending: Emergency Medicine | Admitting: Emergency Medicine

## 2023-08-23 ENCOUNTER — Encounter (HOSPITAL_COMMUNITY): Payer: Self-pay

## 2023-08-23 DIAGNOSIS — Z760 Encounter for issue of repeat prescription: Secondary | ICD-10-CM | POA: Diagnosis not present

## 2023-08-23 DIAGNOSIS — I10 Essential (primary) hypertension: Secondary | ICD-10-CM

## 2023-08-23 MED ORDER — LOSARTAN POTASSIUM 50 MG PO TABS
50.0000 mg | ORAL_TABLET | Freq: Every day | ORAL | 0 refills | Status: DC
  Filled 2023-08-23: qty 30, 30d supply, fill #0

## 2023-08-23 MED ORDER — AMLODIPINE BESYLATE 10 MG PO TABS
10.0000 mg | ORAL_TABLET | Freq: Every day | ORAL | 0 refills | Status: DC
  Filled 2023-08-23: qty 30, 30d supply, fill #0

## 2023-08-23 NOTE — ED Triage Notes (Signed)
Pt requesting on losartan and amlodipine. States appt with PCP on 1/06 and told to come here for refill.
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
## 2023-08-23 NOTE — ED Notes (Signed)
Pt refused blood work today.

## 2023-08-23 NOTE — Telephone Encounter (Signed)
Medication Refill -  Most Recent Primary Care Visit:  Provider: Ricky Stabs J  Department: [facility]  Visit Type: NEW PATIENT  Date: 06/20/2022  Medication: amLODipine (NORVASC) 10 MG tablet  losartan (COZAAR) 50 MG tablet   Has the patient contacted their pharmacy? Yes (Agent: If no, request that the patient contact the pharmacy for the refill. If patient does not wish to contact the pharmacy document the reason why and proceed with request.) (Agent: If yes, when and what did the pharmacy advise?)  Is this the correct pharmacy for this prescription? No If no, delete pharmacy and type the correct one.  This is the patient's preferred pharmacy:  [facility]    Has the prescription been filled recently? No  Is the patient out of the medication? Yes  Has the patient been seen for an appointment in the last year OR does the patient have an upcoming appointment? Yes  Can we respond through MyChart? No  Agent: Please be advised that Rx refills may take up to 3 business days. We ask that you follow-up with your pharmacy.

## 2023-08-23 NOTE — Telephone Encounter (Signed)
Spoke with pt, advised him he would have to be seen for refills since LOV 06/20/22. No updated labs since then as well. Recommended pt go to [facility] on 08/27/23 or [facility]. Pt states he was at [facility] pharmacy and would go up the street to UC so he can get refills and will be there at upcoming appt.   Requested Prescriptions  Pending Prescriptions Disp Refills   amLODipine (NORVASC) 10 MG tablet 30 tablet 2    Sig: Take 1 tablet (10 mg total) by mouth daily.     Cardiovascular: Calcium Channel Blockers 2 Failed - 08/23/2023  2:32 PM      Failed - Last BP in normal range    BP Readings from Last 1 Encounters:  10/25/22 (!) 190/107         Failed - Valid encounter within last 6 months    Recent Outpatient Visits   None     Future Appointments             In 3 weeks Edward Fendt, NP [facility] - Last Heart Rate in normal range    Pulse Readings from Last 1 Encounters:  10/25/22 83          losartan (COZAAR) 50 MG tablet 30 tablet 2    Sig: Take 1 tablet (50 mg total) by mouth daily.     Cardiovascular:  Angiotensin Receptor Blockers Failed - 08/23/2023  2:32 PM      Failed - Cr in normal range and within 180 days    Creatinine, Ser  Date Value Ref Range Status  06/20/2022 1.17 0.76 - 1.27 mg/dL Final         Failed - K in normal range and within 180 days    Potassium  Date Value Ref Range Status  06/20/2022 4.2 3.5 - 5.2 mmol/L Final         Failed - Last BP in normal range    BP Readings from Last 1 Encounters:  10/25/22 (!) 190/107         Failed - Valid encounter within last 6 months    Recent Outpatient Visits   None     Future Appointments             In 3 weeks Edward Fendt, NP [facility] - Patient is not pregnant

## 2023-08-23 NOTE — Discharge Instructions (Addendum)
I have refilled a 30-day supply of your blood pressure medications.  I suggest starting them at night tonight.  This will give you enough supply to make it to your primary care appointment in January.  We have also checked some basic labs and we will contact you if anything requires follow-up.  Seek immediate care or call 911 if you develop a sudden severe, worst headache of your life, vision loss, chest pain, or any new concerning symptoms.

## 2023-08-24 ENCOUNTER — Other Ambulatory Visit (HOSPITAL_BASED_OUTPATIENT_CLINIC_OR_DEPARTMENT_OTHER): Payer: Self-pay

## 2023-09-16 ENCOUNTER — Other Ambulatory Visit (HOSPITAL_COMMUNITY): Payer: Self-pay

## 2023-09-16 ENCOUNTER — Encounter: Payer: Self-pay | Admitting: Family

## 2023-09-16 ENCOUNTER — Ambulatory Visit (INDEPENDENT_AMBULATORY_CARE_PROVIDER_SITE_OTHER): Payer: Commercial Managed Care - HMO | Admitting: Family

## 2023-09-16 VITALS — BP 164/101 | HR 80 | Temp 98.0°F | Ht 64.0 in | Wt 174.8 lb

## 2023-09-16 DIAGNOSIS — Z1211 Encounter for screening for malignant neoplasm of colon: Secondary | ICD-10-CM | POA: Diagnosis not present

## 2023-09-16 DIAGNOSIS — I10 Essential (primary) hypertension: Secondary | ICD-10-CM | POA: Diagnosis not present

## 2023-09-16 MED ORDER — AMLODIPINE BESYLATE 10 MG PO TABS
10.0000 mg | ORAL_TABLET | Freq: Every day | ORAL | 2 refills | Status: DC
  Filled 2023-09-16: qty 30, 30d supply, fill #0
  Filled 2023-12-23: qty 30, 30d supply, fill #1

## 2023-09-16 MED ORDER — LOSARTAN POTASSIUM 50 MG PO TABS
50.0000 mg | ORAL_TABLET | Freq: Every day | ORAL | 2 refills | Status: DC
  Filled 2023-09-16: qty 30, 30d supply, fill #0

## 2023-09-16 NOTE — Progress Notes (Signed)
 Patient ID: Edward Watts, male    DOB: 09-Sep-1971  MRN: [id number]  CC: Medication Refill  Subjective: Edward Watts is a 53 y.o. male who presents for medication refill.   His concerns today include:  08/23/2023 [facility] per NP note: Initial Impression / Assessment and Plan / UC Course  I have reviewed the triage vital signs and the nursing notes.   Pertinent labs & imaging results that were available during my care of the patient were reviewed by me and considered in my medical decision making (see chart for details).   Vitals and triage reviewed, patient is hemodynamically stable.  Lungs are vesicular, heart with regular rate and rhythm.  Blood pressure on recheck 200/100.  Without signs of hypertensive urgency or emergency.  No current vision changes, headache or chest pain.  Strict emergency and follow-up precautions given if the symptoms do develop.  Will check CBC and CMP to ensure adequate kidney function and stable labs for losartan .  Patient agreeable to plan.  Work note provided.   Upon discharge, patient refused labs.  Will discontinue the losartan .  Attempted to contact patient via telephone, no answer.  Today's office visit 09/16/2023: - Doing well on Amlodipine , no issues/concerns. He does not complain of red flag symptoms such as but not limited to chest pain, shortness of breath, worst headache of life, nausea/vomiting.  - Colon cancer screening. Denies associated symptoms.   Patient Active Problem List   Diagnosis Date Noted   Primary hypertension 06/20/2022   Prediabetes 06/20/2022     Current Outpatient Medications on File Prior to Visit  Medication Sig Dispense Refill   amLODipine  (NORVASC ) 10 MG tablet Take 1 tablet (10 mg total) by mouth daily. 30 tablet 0   albuterol  (PROVENTIL  HFA;VENTOLIN  HFA) 108 (90 Base) MCG/ACT inhaler Inhale 1-2 puffs into the lungs every 6 (six) hours as needed for wheezing or shortness  of breath. (Patient not taking: Reported on 09/16/2023) 1 Inhaler 0   [DISCONTINUED] hydrochlorothiazide  (HYDRODIURIL ) 25 MG tablet Take 1 tablet (25 mg total) by mouth daily. 30 tablet 0   No current facility-administered medications on file prior to visit.    No Known Allergies  Social History   Socioeconomic History   Marital status: Single    Spouse name: Not on file   Number of children: Not on file   Years of education: Not on file   Highest education level: Not on file  Occupational History   Not on file  Tobacco Use   Smoking status: Some Days    Types: Cigarettes    Passive exposure: Current   Smokeless tobacco: Never   Tobacco comments:    1-2 cigarettes a day  Vaping Use   Vaping status: Never Used  Substance and Sexual Activity   Alcohol use: No   Drug use: No   Sexual activity: Not on file  Other Topics Concern   Not on file  Social History Narrative   Not on file   Social Drivers of Health   Financial Resource Strain: Not on file  Food Insecurity: Not on file  Transportation Needs: Not on file  Physical Activity: Not on file  Stress: Not on file (07/18/2023)  Social Connections: Not on file  Intimate Partner Violence: Not on file    Family History  Problem Relation Age of Onset   Hypertension Mother    Hypertension Father     No past surgical history on file.  ROS:  Review of Systems Negative except as stated above  PHYSICAL EXAM: BP (!) 164/101   Pulse 80   Temp 98 F (36.7 C) (Oral)   Ht 5' 4 (1.626 m)   Wt 174 lb 12.8 oz (79.3 kg)   SpO2 96%   BMI 30.00 kg/m   Physical Exam HENT:     Head: Normocephalic and atraumatic.     Nose: Nose normal.     Mouth/Throat:     Mouth: Mucous membranes are moist.     Pharynx: Oropharynx is clear.  Eyes:     Extraocular Movements: Extraocular movements intact.     Conjunctiva/sclera: Conjunctivae normal.     Pupils: Pupils are equal, round, and reactive to light.  Cardiovascular:     Rate  and Rhythm: Normal rate and regular rhythm.     Pulses: Normal pulses.     Heart sounds: Normal heart sounds.  Pulmonary:     Effort: Pulmonary effort is normal.     Breath sounds: Normal breath sounds.  Musculoskeletal:        General: Normal range of motion.     Cervical back: Normal range of motion and neck supple.  Neurological:     General: No focal deficit present.     Mental Status: He is alert and oriented to person, place, and time.  Psychiatric:        Mood and Affect: Mood normal.        Behavior: Behavior normal.    ASSESSMENT AND PLAN: 1. Primary hypertension (Primary) - Blood pressure not at goal during today's visit. Patient asymptomatic without chest pressure, chest pain, palpitations, shortness of breath, worst headache of life, and any additional red flag symptoms. - Continue Amlodipine  as prescribed.  - Resume Losartan  as prescribed.  - Routine screening.  - Counseled on blood pressure goal of less than 130/80, low-sodium, DASH diet, medication compliance, and 150 minutes of moderate intensity exercise per week as tolerated. Counseled on medication adherence and adverse effects. - Referral to Cardiology for evaluation/management.  - Follow-up with primary provider in 1 week or sooner if needed for blood pressure check. - amLODipine  (NORVASC ) 10 MG tablet; Take 1 tablet (10 mg total) by mouth daily.  Dispense: 30 tablet; Refill: 2 - losartan  (COZAAR ) 50 MG tablet; Take 1 tablet (50 mg total) by mouth daily.  Dispense: 30 tablet; Refill: 2 - Basic Metabolic Panel - Ambulatory referral to Cardiology  2. Colon cancer screening - Referral to Gastroenterology for colon cancer screening by colonoscopy. - Ambulatory referral to Gastroenterology   Patient was given the opportunity to ask questions.  Patient verbalized understanding of the plan and was able to repeat key elements of the plan. Patient was given clear instructions to go to Emergency Department or return to  medical center if symptoms don't improve, worsen, or new problems develop.The patient verbalized understanding.   Orders Placed This Encounter  Procedures   Basic Metabolic Panel   Ambulatory referral to Gastroenterology   Ambulatory referral to Cardiology     Requested Prescriptions   Signed Prescriptions Disp Refills   amLODipine  (NORVASC ) 10 MG tablet 30 tablet 2    Sig: Take 1 tablet (10 mg total) by mouth daily.   losartan  (COZAAR ) 50 MG tablet 30 tablet 2    Sig: Take 1 tablet (50 mg total) by mouth daily.    Return in about 1 week (around 09/23/2023) for Follow-Up or next available blood pressure check.  Greig JINNY Drones, NP

## 2023-09-16 NOTE — Progress Notes (Signed)
 No other concerns to discuss.

## 2023-09-17 LAB — BASIC METABOLIC PANEL
BUN/Creatinine Ratio: 17 (ref 9–20)
BUN: 21 mg/dL (ref 6–24)
CO2: 22 mmol/L (ref 20–29)
Calcium: 9.3 mg/dL (ref 8.7–10.2)
Chloride: 109 mmol/L — ABNORMAL HIGH (ref 96–106)
Creatinine, Ser: 1.21 mg/dL (ref 0.76–1.27)
Glucose: 104 mg/dL — ABNORMAL HIGH (ref 70–99)
Potassium: 4.5 mmol/L (ref 3.5–5.2)
Sodium: 145 mmol/L — ABNORMAL HIGH (ref 134–144)
eGFR: 72 mL/min/{1.73_m2} (ref >59)

## 2023-09-23 ENCOUNTER — Ambulatory Visit (INDEPENDENT_AMBULATORY_CARE_PROVIDER_SITE_OTHER): Payer: Commercial Managed Care - HMO | Admitting: Family

## 2023-09-23 ENCOUNTER — Other Ambulatory Visit (HOSPITAL_COMMUNITY): Payer: Self-pay

## 2023-09-23 VITALS — BP 158/98 | HR 84 | Temp 97.7°F | Resp 16 | Wt 175.8 lb

## 2023-09-23 DIAGNOSIS — I10 Essential (primary) hypertension: Secondary | ICD-10-CM

## 2023-09-23 MED ORDER — LOSARTAN POTASSIUM 100 MG PO TABS
100.0000 mg | ORAL_TABLET | Freq: Every day | ORAL | 0 refills | Status: DC
  Filled 2023-09-23: qty 90, 90d supply, fill #0

## 2023-09-23 NOTE — Progress Notes (Signed)
 Patient ID: Edward Watts, male    DOB: 1971-08-23  MRN: [id number]  CC: Chronic Conditions Follow-Up  Subjective: Edward Watts is a 53 y.o. male who presents for chronic conditions follow-up.   His concerns today include:  - Doing well on Amlodipine  and Losartan , no issues/concerns. He does not complain of red flag symptoms such as but not limited to chest pain, shortness of breath, worst headache of life, nausea/vomiting.  Since previous office visit he did not establish with Cardiology. Patient provided with Cardiology referral contact information and aware to notify primary provider if unable to schedule an appointment. Patient verbalized understanding/agreement.   Patient Active Problem List   Diagnosis Date Noted   Primary hypertension 06/20/2022   Prediabetes 06/20/2022     Current Outpatient Medications on File Prior to Visit  Medication Sig Dispense Refill   albuterol  (PROVENTIL  HFA;VENTOLIN  HFA) 108 (90 Base) MCG/ACT inhaler Inhale 1-2 puffs into the lungs every 6 (six) hours as needed for wheezing or shortness of breath. (Patient not taking: Reported on 09/16/2023) 1 Inhaler 0   amLODipine  (NORVASC ) 10 MG tablet Take 1 tablet (10 mg total) by mouth daily. 30 tablet 0   amLODipine  (NORVASC ) 10 MG tablet Take 1 tablet (10 mg total) by mouth daily. 30 tablet 2   [DISCONTINUED] hydrochlorothiazide  (HYDRODIURIL ) 25 MG tablet Take 1 tablet (25 mg total) by mouth daily. 30 tablet 0   No current facility-administered medications on file prior to visit.    No Known Allergies  Social History   Socioeconomic History   Marital status: Single    Spouse name: Not on file   Number of children: Not on file   Years of education: Not on file   Highest education level: Not on file  Occupational History   Not on file  Tobacco Use   Smoking status: Some Days    Types: Cigarettes    Passive exposure: Current   Smokeless tobacco: Never   Tobacco comments:    1-2  cigarettes a day  Vaping Use   Vaping status: Never Used  Substance and Sexual Activity   Alcohol use: No   Drug use: No   Sexual activity: Not on file  Other Topics Concern   Not on file  Social History Narrative   Not on file   Social Drivers of Health   Financial Resource Strain: Not on file  Food Insecurity: Not on file  Transportation Needs: Not on file  Physical Activity: Not on file  Stress: Not on file (07/18/2023)  Social Connections: Not on file  Intimate Partner Violence: Not on file    Family History  Problem Relation Age of Onset   Hypertension Mother    Hypertension Father     No past surgical history on file.  ROS: Review of Systems Negative except as stated above  PHYSICAL EXAM: BP (!) 158/98   Pulse 84   Temp 97.7 F (36.5 C) (Oral)   Resp 16   Wt 175 lb 12.8 oz (79.7 kg)   SpO2 95%   BMI 30.18 kg/m   Physical Exam HENT:     Head: Normocephalic and atraumatic.     Nose: Nose normal.     Mouth/Throat:     Mouth: Mucous membranes are moist.     Pharynx: Oropharynx is clear.  Eyes:     Extraocular Movements: Extraocular movements intact.     Conjunctiva/sclera: Conjunctivae normal.     Pupils: Pupils are equal, round, and reactive to  light.  Cardiovascular:     Rate and Rhythm: Normal rate and regular rhythm.     Pulses: Normal pulses.     Heart sounds: Normal heart sounds.  Pulmonary:     Effort: Pulmonary effort is normal.     Breath sounds: Normal breath sounds.  Musculoskeletal:        General: Normal range of motion.     Cervical back: Normal range of motion and neck supple.  Neurological:     General: No focal deficit present.     Mental Status: He is alert and oriented to person, place, and time.  Psychiatric:        Mood and Affect: Mood normal.        Behavior: Behavior normal.     ASSESSMENT AND PLAN: 1. Primary hypertension (Primary) - Continue Amlodipine  as prescribed. No refills needed as of present. - Increase  Losartan  from 50 mg to 100 mg as prescribed.  - Counseled on blood pressure goal of less than 130/80, low-sodium, DASH diet, medication compliance, and 150 minutes of moderate intensity exercise per week as tolerated. Counseled on medication adherence and adverse effects. - Keep all scheduled appointments with Cardiology.  - Follow-up with primary provider as scheduled.  - losartan  (COZAAR ) 100 MG tablet; Take 1 tablet (100 mg total) by mouth daily.  Dispense: 90 tablet; Refill: 0    Patient was given the opportunity to ask questions.  Patient verbalized understanding of the plan and was able to repeat key elements of the plan. Patient was given clear instructions to go to Emergency Department or return to medical center if symptoms don't improve, worsen, or new problems develop.The patient verbalized understanding.   Requested Prescriptions   Signed Prescriptions Disp Refills   losartan  (COZAAR ) 100 MG tablet 90 tablet 0    Sig: Take 1 tablet (100 mg total) by mouth daily.    Follow-up with primary provider as scheduled.   Greig JINNY Drones, NP

## 2023-09-23 NOTE — Patient Instructions (Signed)
Please call [facility] Address:[address] Phone #: [phone].

## 2023-09-23 NOTE — Progress Notes (Signed)
Patient is here for 1wk follow-up BP Patent has uncontrolled hypertension Provider is  aware of readings

## 2023-09-24 ENCOUNTER — Other Ambulatory Visit (HOSPITAL_COMMUNITY): Payer: Self-pay

## 2023-12-23 ENCOUNTER — Other Ambulatory Visit (HOSPITAL_COMMUNITY): Payer: Self-pay

## 2023-12-24 ENCOUNTER — Other Ambulatory Visit (HOSPITAL_COMMUNITY): Payer: Self-pay

## 2023-12-24 ENCOUNTER — Ambulatory Visit (INDEPENDENT_AMBULATORY_CARE_PROVIDER_SITE_OTHER)

## 2023-12-24 ENCOUNTER — Encounter (HOSPITAL_COMMUNITY): Payer: Self-pay | Admitting: Emergency Medicine

## 2023-12-24 ENCOUNTER — Other Ambulatory Visit: Payer: Self-pay

## 2023-12-24 ENCOUNTER — Ambulatory Visit (HOSPITAL_COMMUNITY)
Admission: EM | Admit: 2023-12-24 | Discharge: 2023-12-24 | Disposition: A | Discharge status: Home / Self Care | Attending: Emergency Medicine | Admitting: Emergency Medicine

## 2023-12-24 DIAGNOSIS — J4521 Mild intermittent asthma with (acute) exacerbation: Secondary | ICD-10-CM | POA: Diagnosis not present

## 2023-12-24 DIAGNOSIS — R053 Chronic cough: Secondary | ICD-10-CM

## 2023-12-24 DIAGNOSIS — I10 Essential (primary) hypertension: Secondary | ICD-10-CM | POA: Diagnosis not present

## 2023-12-24 DIAGNOSIS — J00 Acute nasopharyngitis [common cold]: Secondary | ICD-10-CM | POA: Diagnosis not present

## 2023-12-24 MED ORDER — ALBUTEROL SULFATE HFA 108 (90 BASE) MCG/ACT IN AERS
2.0000 | INHALATION_SPRAY | Freq: Four times a day (QID) | RESPIRATORY_TRACT | 2 refills | Status: AC | PRN
  Filled 2023-12-24: qty 6.7, 25d supply, fill #0

## 2023-12-24 MED ORDER — AMLODIPINE BESYLATE 10 MG PO TABS
10.0000 mg | ORAL_TABLET | Freq: Every day | ORAL | 2 refills | Status: DC
  Filled 2023-12-24: qty 30, 30d supply, fill #0
  Filled 2024-01-20 – 2024-02-20 (×2): qty 30, 30d supply, fill #1
  Filled 2024-04-24: qty 30, 30d supply, fill #2

## 2023-12-24 MED ORDER — HYDROCHLOROTHIAZIDE 25 MG PO TABS
25.0000 mg | ORAL_TABLET | Freq: Every morning | ORAL | 2 refills | Status: AC
  Filled 2023-12-24: qty 30, 30d supply, fill #0
  Filled 2024-01-20 – 2024-02-20 (×2): qty 30, 30d supply, fill #1
  Filled 2024-04-24: qty 30, 30d supply, fill #2

## 2023-12-24 MED ORDER — CETIRIZINE HCL 10 MG PO TABS
10.0000 mg | ORAL_TABLET | Freq: Every day | ORAL | 2 refills | Status: AC
  Filled 2023-12-24: qty 30, 30d supply, fill #0
  Filled 2024-01-20: qty 30, 30d supply, fill #1

## 2023-12-24 MED ORDER — IPRATROPIUM BROMIDE 0.06 % NA SOLN
2.0000 | Freq: Three times a day (TID) | NASAL | 2 refills | Status: AC
  Filled 2023-12-24: qty 15, 14d supply, fill #0
  Filled 2024-01-20: qty 15, 14d supply, fill #1

## 2023-12-24 MED ORDER — LOSARTAN POTASSIUM 100 MG PO TABS
100.0000 mg | ORAL_TABLET | Freq: Every day | ORAL | 2 refills | Status: DC
  Filled 2023-12-24: qty 30, 30d supply, fill #0
  Filled 2024-02-20: qty 30, 30d supply, fill #1
  Filled 2024-04-24: qty 30, 30d supply, fill #2

## 2023-12-24 NOTE — ED Triage Notes (Signed)
 Persistent cough for the past 2 weeks not getting better with OTC medication.

## 2023-12-24 NOTE — ED Notes (Signed)
 EKG done

## 2023-12-24 NOTE — ED Notes (Signed)
 PT has now agreed to have EKG

## 2023-12-24 NOTE — ED Notes (Signed)
 BP check twice in different arms. Still very High BP reading. Pt states he is not been taking BP meds for few days.

## 2023-12-24 NOTE — ED Notes (Signed)
 PT refused EKG because he was not here for that. This Clinical research associate reported to Provider Nadyne Austin that Pt refused EKG.

## 2023-12-24 NOTE — Discharge Instructions (Addendum)
 Your blood pressure is very elevated today.  I have refilled your prescriptions for amlodipine, hydrochlorothiazide and losartan.  Please take your amlodipine and hydrochlorothiazide in the morning and take your losartan at bedtime every day.  Your chest x-ray was normal in appearance, no concern for pneumonia, emphysema or bronchitis.  Your EKG today was, fortunately, normal as well.  Due to your history of asthma requiring albuterol in the past as well as your physical exam findings concerning for seasonal allergies, I recommend that you begin the following medications:  Zyrtec (cetirizine): This is an excellent second-generation antihistamine that helps to reduce respiratory inflammatory response to environmental allergens.  In some patients, this medication can cause daytime sleepiness so I recommend that you take 1 tablet daily at bedtime.     Atrovent (ipratropium): This is an excellent nasal decongestant spray that does not cause rebound congestion.  Please instill 2 sprays into each nare with each use, you may use this up to 3 times daily.  Once you find that you are forgetting to use the spray more often that you remember to use it, you will know that you no longer need it.   ProAir, Ventolin, Proventil (albuterol): This inhaled medication contains a short acting beta agonist bronchodilator.  This medication relaxes the smooth muscle of the airway in the lungs.  When these muscles are tight, breathing becomes more constricted.  The result of relaxation of the smooth muscle is increased air movement and improved work of breathing.  This is a short acting medication that can be used every 4-6 hours as needed for increased work of breathing, shortness of breath, wheezing and excessive coughing.  It comes in the form of a handheld inhaler or nebulizer solution.  I recommended that for the next 3 to 4 days, this medication is used 4 times daily on a scheduled basis then decrease to twice daily and as  needed until symptoms have completely resolved which I anticipate will be several weeks.   If symptoms have not meaningfully improved in the next 3 to 5 days, please return for repeat evaluation or follow-up with your regular provider.  If symptoms have worsened in the next 3 to 5 days, please return for repeat evaluation or follow-up with your regular provider.    Thank you for visiting urgent care today.  We appreciate the opportunity to participate in your care.
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
## 2023-12-26 ENCOUNTER — Other Ambulatory Visit (HOSPITAL_COMMUNITY): Payer: Self-pay

## 2023-12-30 ENCOUNTER — Ambulatory Visit: Payer: Commercial Managed Care - HMO | Attending: Cardiology | Admitting: Cardiology

## 2023-12-31 ENCOUNTER — Telehealth (HOSPITAL_COMMUNITY): Payer: Self-pay | Admitting: Emergency Medicine

## 2023-12-31 NOTE — Telephone Encounter (Signed)
 This RN returned patient's call. Identified patient using patient identifiers. Pt states that he was seen here on 12/24/23 and his cough was still not any better and requesting different medications sent in to pharmacy for him. This RN informed patient that since the treatment did not help and had been a week, he would have to come back in to be re-seen if he was wanting different treatment. Patient states that he can't keep missing work. This RN informed patient that he could contact his PCP to see if they would send in different medications. Pt verbalized understanding.

## 2024-01-20 ENCOUNTER — Other Ambulatory Visit (HOSPITAL_COMMUNITY): Payer: Self-pay

## 2024-01-30 ENCOUNTER — Other Ambulatory Visit (HOSPITAL_COMMUNITY): Payer: Self-pay

## 2024-02-20 ENCOUNTER — Other Ambulatory Visit (HOSPITAL_COMMUNITY): Payer: Self-pay

## 2024-04-24 ENCOUNTER — Other Ambulatory Visit (HOSPITAL_COMMUNITY): Payer: Self-pay

## 2024-07-22 ENCOUNTER — Other Ambulatory Visit: Payer: Self-pay | Admitting: Family

## 2024-07-22 ENCOUNTER — Other Ambulatory Visit (HOSPITAL_COMMUNITY): Payer: Self-pay

## 2024-07-23 NOTE — Telephone Encounter (Signed)
 Pt scheduled

## 2024-07-23 NOTE — Telephone Encounter (Signed)
 Schedule appointment. Last office visit 09/23/2023. During the interim report to the Emergency Department/Urgent Care/call 911 for immediate medical evaluation.

## 2024-07-27 ENCOUNTER — Other Ambulatory Visit (HOSPITAL_COMMUNITY): Payer: Self-pay

## 2024-07-27 ENCOUNTER — Encounter (HOSPITAL_COMMUNITY): Payer: Self-pay

## 2024-09-07 ENCOUNTER — Ambulatory Visit: Admitting: Family

## 2024-09-07 ENCOUNTER — Ambulatory Visit (INDEPENDENT_AMBULATORY_CARE_PROVIDER_SITE_OTHER)

## 2024-09-07 ENCOUNTER — Other Ambulatory Visit: Payer: Self-pay | Admitting: Family

## 2024-09-07 ENCOUNTER — Ambulatory Visit: Payer: Self-pay | Admitting: Family

## 2024-09-07 VITALS — BP 167/109 | HR 87 | Temp 98.7°F | Resp 16 | Ht 64.5 in | Wt 175.6 lb

## 2024-09-07 DIAGNOSIS — R059 Cough, unspecified: Secondary | ICD-10-CM

## 2024-09-07 DIAGNOSIS — I10 Essential (primary) hypertension: Secondary | ICD-10-CM | POA: Diagnosis not present

## 2024-09-07 DIAGNOSIS — J9811 Atelectasis: Secondary | ICD-10-CM

## 2024-09-07 DIAGNOSIS — K0889 Other specified disorders of teeth and supporting structures: Secondary | ICD-10-CM

## 2024-09-07 DIAGNOSIS — Z0120 Encounter for dental examination and cleaning without abnormal findings: Secondary | ICD-10-CM

## 2024-09-07 LAB — POCT RAPID STREP A (OFFICE): Rapid Strep A Screen: NEGATIVE

## 2024-09-07 MED ORDER — BENZONATATE 100 MG PO CAPS: 100.0000 mg | ORAL_CAPSULE | Freq: Two times a day (BID) | ORAL | 1 refills | Status: AC | PRN

## 2024-09-07 MED ORDER — AMOXICILLIN-POT CLAVULANATE 875-125 MG PO TABS: 1.0000 | ORAL_TABLET | Freq: Two times a day (BID) | ORAL | 0 refills | Status: AC

## 2024-09-07 MED ORDER — HYDROCHLOROTHIAZIDE 12.5 MG PO TABS: 12.5000 mg | ORAL_TABLET | Freq: Every day | ORAL | 0 refills | Status: AC

## 2024-09-07 MED ORDER — AMLODIPINE BESYLATE 10 MG PO TABS: 10.0000 mg | ORAL_TABLET | Freq: Every day | ORAL | 0 refills | Status: AC

## 2024-09-07 MED ORDER — LOSARTAN POTASSIUM 100 MG PO TABS: 100.0000 mg | ORAL_TABLET | Freq: Every day | ORAL | 0 refills | Status: AC

## 2024-09-07 NOTE — Progress Notes (Signed)
 "   Patient ID: Edward Watts, male    DOB: Jun 12, 1971  MRN: [id number]  CC: Chronic Conditions Follow-Up  Subjective: Edward Watts is a 53 y.o. male who presents for chronic conditions follow-up.   His concerns today include:  - States he does not consistently take Amlodipine  and Losartan  as he should. States since previous office visit he did not establish with Cardiology and would like referral back to the same. He does not complain of red flag symptoms such as but not limited to chest pain, shortness of breath, worst headache of life, nausea/vomiting.  - Cough persisting. Denies red flag symptoms. Taking over-the-counter medications with minimal relief.   Patient Active Problem List   Diagnosis Date Noted   Primary hypertension 06/20/2022   Prediabetes 06/20/2022     Medications Ordered Prior to Encounter[1]  Allergies[2]  Social History   Socioeconomic History   Marital status: Single    Spouse name: Not on file   Number of children: Not on file   Years of education: Not on file   Highest education level: Associate degree: academic program  Occupational History   Not on file  Tobacco Use   Smoking status: Some Days    Types: Cigarettes    Passive exposure: Current   Smokeless tobacco: Never   Tobacco comments:    1-2 cigarettes a day  Vaping Use   Vaping status: Never Used  Substance and Sexual Activity   Alcohol use: No   Drug use: No   Sexual activity: Not on file  Other Topics Concern   Not on file  Social History Narrative   Not on file   Social Drivers of Health   Tobacco Use: High Risk (09/07/2024)   Patient History    Smoking Tobacco Use: Some Days    Smokeless Tobacco Use: Never    Passive Exposure: Current  Financial Resource Strain: Medium Risk (09/07/2024)   Overall Financial Resource Strain (CARDIA)    Difficulty of Paying Living Expenses: Somewhat hard  Food Insecurity: Food Insecurity Present (09/07/2024)   Epic     Worried About Programme Researcher, Broadcasting/film/video in the Last Year: Sometimes true    Ran Out of Food in the Last Year: Sometimes true  Transportation Needs: No Transportation Needs (09/07/2024)   Epic    Lack of Transportation (Medical): No    Lack of Transportation (Non-Medical): No  Physical Activity: Sufficiently Active (09/07/2024)   Exercise Vital Sign    Days of Exercise per Week: 3 days    Minutes of Exercise per Session: 80 min  Stress: Stress Concern Present (09/07/2024)   [employer] of Occupational Health - Occupational Stress Questionnaire    Feeling of Stress: To some extent  Social Connections: Unknown (09/07/2024)   Social Connection and Isolation Panel    Frequency of Communication with Friends and Family: Not on file    Frequency of Social Gatherings with Friends and Family: Once a week    Attends Religious Services: More than 4 times per year    Active Member of [employer] or Organizations: Not on file    Attends Banker Meetings: Not on file    Marital Status: Married  Intimate Partner Violence: Not on file  Depression (PHQ2-9): Low Risk (09/07/2024)   Depression (PHQ2-9)    PHQ-2 Score: 4  Alcohol Screen: Not on file  Housing: Low Risk (09/07/2024)   Epic    Unable to Pay for Housing in the Last Year: No  Number of Times Moved in the Last Year: 1    Homeless in the Last Year: No  Utilities: Not on file  Health Literacy: Not on file    Family History  Problem Relation Age of Onset   Hypertension Mother    Hypertension Father     No past surgical history on file.  ROS: Review of Systems Negative except as stated above  PHYSICAL EXAM: BP (!) 167/109   Pulse 87   Temp 98.7 F (37.1 C) (Oral)   Resp 16   Ht 5' 4.5 (1.638 m)   Wt 175 lb 9.6 oz (79.7 kg)   SpO2 95%   BMI 29.68 kg/m   Physical Exam HENT:     Head: Normocephalic and atraumatic.     Nose: Nose normal.     Mouth/Throat:     Mouth: Mucous membranes are moist.     Pharynx:  Oropharynx is clear.  Eyes:     Extraocular Movements: Extraocular movements intact.     Conjunctiva/sclera: Conjunctivae normal.     Pupils: Pupils are equal, round, and reactive to light.  Cardiovascular:     Rate and Rhythm: Normal rate and regular rhythm.     Pulses: Normal pulses.     Heart sounds: Normal heart sounds.  Pulmonary:     Effort: Pulmonary effort is normal.     Breath sounds: Normal breath sounds.  Musculoskeletal:        General: Normal range of motion.     Cervical back: Normal range of motion and neck supple.  Neurological:     General: No focal deficit present.     Mental Status: He is alert and oriented to person, place, and time.  Psychiatric:        Mood and Affect: Mood normal.        Behavior: Behavior normal.    ASSESSMENT AND PLAN: 1. Primary hypertension (Primary) - Blood pressure not at goal during today's visit. Patient asymptomatic without chest pressure, chest pain, palpitations, shortness of breath, worst headache of life, and any additional red flag symptoms. - Continue Amlodipine  and Losartan  as prescribed.  - Trial Hydrochlorothiazide  as prescribed.  - Routine screening.  - Counseled on blood pressure goal of less than 130/80, low-sodium, DASH diet, medication compliance, and 150 minutes of moderate intensity exercise per week as tolerated. Counseled on medication adherence and adverse effects. - Referral to Cardiology for evaluation/management.  - Follow-up with primary provider in 4 weeks or sooner if needed.  - amLODipine  (NORVASC ) 10 MG tablet; Take 1 tablet (10 mg total) by mouth daily.  Dispense: 90 tablet; Refill: 0 - losartan  (COZAAR ) 100 MG tablet; Take 1 tablet (100 mg total) by mouth daily.  Dispense: 90 tablet; Refill: 0 - hydrochlorothiazide  (HYDRODIURIL ) 12.5 MG tablet; Take 1 tablet (12.5 mg total) by mouth daily.  Dispense: 90 tablet; Refill: 0 - Basic Metabolic Panel - Ambulatory referral to Cardiology  2. Cough, unspecified  type - Patient today in office with no cardiopulmonary/acute distress.  - Benzonatate  as prescribed. Counseled on medication adherence/adverse effects.  - DG Chest 2 View for evaluation.  - Routine screening.  - Follow-up with primary provider as scheduled.  - benzonatate  (TESSALON ) 100 MG capsule; Take 1 capsule (100 mg total) by mouth 2 (two) times daily as needed for cough.  Dispense: 90 capsule; Refill: 1 - DG Chest 2 View; Future - COVID-19, Flu A+B and RSV - POCT rapid strep A; Future - Culture, Group A Strep - POCT  rapid strep A   Patient was given the opportunity to ask questions.  Patient verbalized understanding of the plan and was able to repeat key elements of the plan. Patient was given clear instructions to go to Emergency Department or return to medical center if symptoms don't improve, worsen, or new problems develop.The patient verbalized understanding.   Orders Placed This Encounter  Procedures   COVID-19, Flu A+B and RSV   Culture, Group A Strep   DG Chest 2 View   Basic Metabolic Panel   Ambulatory referral to Cardiology   POCT rapid strep A     Requested Prescriptions   Signed Prescriptions Disp Refills   amLODipine  (NORVASC ) 10 MG tablet 90 tablet 0    Sig: Take 1 tablet (10 mg total) by mouth daily.   losartan  (COZAAR ) 100 MG tablet 90 tablet 0    Sig: Take 1 tablet (100 mg total) by mouth daily.   hydrochlorothiazide  (HYDRODIURIL ) 12.5 MG tablet 90 tablet 0    Sig: Take 1 tablet (12.5 mg total) by mouth daily.   benzonatate  (TESSALON ) 100 MG capsule 90 capsule 1    Sig: Take 1 capsule (100 mg total) by mouth 2 (two) times daily as needed for cough.    Return in about 4 weeks (around 10/05/2024) for Follow-Up or next available chronic conditions.  Greig JINNY Chute, NP      [1]  Current Outpatient Medications on File Prior to Visit  Medication Sig Dispense Refill   albuterol  (VENTOLIN  HFA) 108 (90 Base) MCG/ACT inhaler Inhale 2 puffs into the lungs  every 6 (six) hours as needed for wheezing or shortness of breath (Cough). 6.7 g 2   hydrochlorothiazide  (HYDRODIURIL ) 25 MG tablet Take 1 tablet (25 mg total) by mouth in the morning. 30 tablet 2   ipratropium (ATROVENT ) 0.06 % nasal spray Place 2 sprays into both nostrils 3 (three) times daily, as needed for nasal congestion, runny nose. 15 mL 2   cetirizine  (ZYRTEC  ALLERGY) 10 MG tablet Take 1 tablet (10 mg total) by mouth at bedtime. 30 tablet 2   No current facility-administered medications on file prior to visit.  [2] No Known Allergies  "

## 2024-09-08 LAB — BASIC METABOLIC PANEL WITH GFR
BUN/Creatinine Ratio: 14 (ref 9–20)
BUN: 16 mg/dL (ref 6–24)
CO2: 22 mmol/L (ref 20–29)
Calcium: 10.1 mg/dL (ref 8.7–10.2)
Chloride: 101 mmol/L (ref 96–106)
Creatinine, Ser: 1.18 mg/dL (ref 0.76–1.27)
Glucose: 103 mg/dL — ABNORMAL HIGH (ref 70–99)
Potassium: 4.2 mmol/L (ref 3.5–5.2)
Sodium: 141 mmol/L (ref 134–144)
eGFR: 74 mL/min/1.73

## 2024-09-09 LAB — COVID-19, FLU A+B AND RSV
Influenza A, NAA: NOT DETECTED
Influenza B, NAA: NOT DETECTED
RSV, NAA: NOT DETECTED
SARS-CoV-2, NAA: NOT DETECTED

## 2024-09-09 LAB — CULTURE, GROUP A STREP: Strep A Culture: NEGATIVE

## 2024-10-12 ENCOUNTER — Ambulatory Visit: Payer: Self-pay | Admitting: Family

## 2024-10-29 ENCOUNTER — Ambulatory Visit (HOSPITAL_BASED_OUTPATIENT_CLINIC_OR_DEPARTMENT_OTHER): Admitting: Pulmonary Disease
